# Patient Record
Sex: Female | Born: 1935 | Race: White | Hispanic: No | Smoking: Former smoker
Health system: Southern US, Community
[De-identification: ages and names within clinical notes are randomized; demographics above are authoritative.]

## PROBLEM LIST (undated history)

## (undated) DIAGNOSIS — I219 Acute myocardial infarction, unspecified: Secondary | ICD-10-CM

## (undated) DIAGNOSIS — I10 Essential (primary) hypertension: Secondary | ICD-10-CM

## (undated) DIAGNOSIS — I639 Cerebral infarction, unspecified: Secondary | ICD-10-CM

## (undated) DIAGNOSIS — M479 Spondylosis, unspecified: Secondary | ICD-10-CM

## (undated) DIAGNOSIS — Z87448 Personal history of other diseases of urinary system: Secondary | ICD-10-CM

## (undated) DIAGNOSIS — E785 Hyperlipidemia, unspecified: Secondary | ICD-10-CM

## (undated) DIAGNOSIS — I739 Peripheral vascular disease, unspecified: Secondary | ICD-10-CM

## (undated) HISTORY — DX: Spondylosis, unspecified: M47.9

## (undated) HISTORY — DX: Peripheral vascular disease, unspecified: I73.9

## (undated) HISTORY — DX: Hyperlipidemia, unspecified: E78.5

## (undated) HISTORY — DX: Essential (primary) hypertension: I10

## (undated) HISTORY — DX: Personal history of other diseases of urinary system: Z87.448

## (undated) HISTORY — DX: Acute myocardial infarction, unspecified: I21.9

## (undated) HISTORY — PX: JOINT REPLACEMENT: SHX530

## (undated) HISTORY — DX: Cerebral infarction, unspecified: I63.9

---

## 1966-09-18 HISTORY — PX: ABDOMINAL HYSTERECTOMY: SHX81

## 2001-09-18 HISTORY — PX: BLADDER SUSPENSION: SHX72

## 2004-07-07 ENCOUNTER — Ambulatory Visit: Payer: Self-pay

## 2004-09-18 HISTORY — PX: FRACTURE SURGERY: SHX138

## 2004-09-27 ENCOUNTER — Ambulatory Visit: Payer: Self-pay | Admitting: Orthopaedic Surgery

## 2004-09-29 ENCOUNTER — Ambulatory Visit: Payer: Self-pay | Admitting: Orthopaedic Surgery

## 2006-02-16 ENCOUNTER — Emergency Department: Payer: Self-pay | Admitting: Emergency Medicine

## 2007-01-23 ENCOUNTER — Emergency Department: Payer: Self-pay | Admitting: Emergency Medicine

## 2007-01-25 ENCOUNTER — Emergency Department: Payer: Self-pay | Admitting: Unknown Physician Specialty

## 2007-07-18 ENCOUNTER — Ambulatory Visit: Payer: Self-pay

## 2007-07-25 ENCOUNTER — Ambulatory Visit: Payer: Self-pay

## 2007-08-08 ENCOUNTER — Ambulatory Visit: Payer: Self-pay

## 2007-08-27 ENCOUNTER — Ambulatory Visit: Payer: Self-pay | Admitting: Surgery

## 2007-09-17 ENCOUNTER — Ambulatory Visit: Payer: Self-pay | Admitting: Surgery

## 2007-11-21 ENCOUNTER — Ambulatory Visit: Payer: Self-pay | Admitting: Internal Medicine

## 2008-01-20 ENCOUNTER — Ambulatory Visit: Payer: Self-pay | Admitting: Otolaryngology

## 2008-03-12 ENCOUNTER — Ambulatory Visit: Payer: Self-pay | Admitting: Unknown Physician Specialty

## 2008-07-23 ENCOUNTER — Ambulatory Visit: Payer: Self-pay | Admitting: Internal Medicine

## 2008-09-20 ENCOUNTER — Emergency Department: Payer: Self-pay | Admitting: Emergency Medicine

## 2008-09-23 ENCOUNTER — Ambulatory Visit: Payer: Self-pay | Admitting: Specialist

## 2008-09-29 ENCOUNTER — Ambulatory Visit: Payer: Self-pay | Admitting: Internal Medicine

## 2008-10-01 ENCOUNTER — Ambulatory Visit: Payer: Self-pay | Admitting: Internal Medicine

## 2008-11-11 ENCOUNTER — Emergency Department: Payer: Self-pay | Admitting: Unknown Physician Specialty

## 2009-02-25 ENCOUNTER — Ambulatory Visit: Payer: Self-pay | Admitting: Orthopedic Surgery

## 2009-03-19 ENCOUNTER — Ambulatory Visit: Payer: Self-pay | Admitting: Internal Medicine

## 2009-03-19 ENCOUNTER — Ambulatory Visit: Payer: Self-pay | Admitting: Orthopedic Surgery

## 2009-03-24 ENCOUNTER — Ambulatory Visit: Payer: Self-pay | Admitting: Orthopedic Surgery

## 2009-06-04 ENCOUNTER — Ambulatory Visit: Payer: Self-pay | Admitting: Internal Medicine

## 2009-07-22 ENCOUNTER — Ambulatory Visit: Payer: Self-pay | Admitting: Internal Medicine

## 2009-09-06 ENCOUNTER — Ambulatory Visit: Payer: Self-pay | Admitting: General Practice

## 2009-09-14 ENCOUNTER — Inpatient Hospital Stay: Payer: Self-pay | Admitting: General Practice

## 2009-09-30 ENCOUNTER — Inpatient Hospital Stay: Payer: Self-pay | Admitting: Internal Medicine

## 2010-03-10 ENCOUNTER — Ambulatory Visit: Payer: Self-pay | Admitting: Internal Medicine

## 2010-06-30 ENCOUNTER — Ambulatory Visit: Payer: Self-pay | Admitting: Surgery

## 2010-08-12 ENCOUNTER — Ambulatory Visit: Payer: Self-pay | Admitting: Vascular Surgery

## 2010-08-18 ENCOUNTER — Ambulatory Visit: Payer: Self-pay | Admitting: Vascular Surgery

## 2010-09-18 HISTORY — PX: CAROTID ENDARTERECTOMY: SUR193

## 2010-09-23 ENCOUNTER — Inpatient Hospital Stay: Payer: Medicare Other | Admitting: Vascular Surgery

## 2010-09-28 LAB — PATHOLOGY REPORT

## 2010-12-13 ENCOUNTER — Ambulatory Visit: Payer: Medicare Other | Admitting: Vascular Surgery

## 2011-02-17 DIAGNOSIS — Z8679 Personal history of other diseases of the circulatory system: Secondary | ICD-10-CM | POA: Insufficient documentation

## 2011-02-17 DIAGNOSIS — I639 Cerebral infarction, unspecified: Secondary | ICD-10-CM

## 2011-02-17 HISTORY — DX: Cerebral infarction, unspecified: I63.9

## 2011-02-19 LAB — HM MAMMOGRAPHY

## 2011-02-25 ENCOUNTER — Emergency Department: Payer: Medicare Other | Admitting: Emergency Medicine

## 2011-03-15 ENCOUNTER — Emergency Department: Payer: Medicare Other | Admitting: Emergency Medicine

## 2011-04-12 ENCOUNTER — Encounter: Payer: Medicare Other | Admitting: Internal Medicine

## 2011-04-19 LAB — HM MAMMOGRAPHY: HM Mammogram: NORMAL

## 2011-05-08 ENCOUNTER — Telehealth: Payer: Self-pay | Admitting: Internal Medicine

## 2011-05-08 NOTE — Telephone Encounter (Signed)
Called and scheduled appt for patient

## 2011-05-10 ENCOUNTER — Ambulatory Visit: Payer: Medicare Other | Admitting: Internal Medicine

## 2011-05-19 ENCOUNTER — Encounter: Payer: Self-pay | Admitting: Internal Medicine

## 2011-05-24 ENCOUNTER — Other Ambulatory Visit: Payer: Self-pay | Admitting: Internal Medicine

## 2011-05-25 ENCOUNTER — Telehealth: Payer: Self-pay | Admitting: Internal Medicine

## 2011-05-26 ENCOUNTER — Other Ambulatory Visit: Payer: Self-pay | Admitting: Internal Medicine

## 2011-05-29 MED ORDER — LEVETIRACETAM 1000 MG PO TABS: 1000.0000 mg | ORAL_TABLET | Freq: Two times a day (BID) | ORAL | Status: DC

## 2011-05-29 NOTE — Telephone Encounter (Signed)
Was this my patient or Teresa's?  Thanks

## 2011-05-30 ENCOUNTER — Telehealth: Payer: Self-pay | Admitting: Internal Medicine

## 2011-05-30 ENCOUNTER — Ambulatory Visit: Payer: Self-pay | Admitting: Internal Medicine

## 2011-05-30 NOTE — Telephone Encounter (Signed)
Patient was late to her appt so she was asked to reschedule and she was fine with that but she wanted me to ask you about a medication.  She stated her urologist in [facility] prescribed levetiracetam 1,000 mg one tablet two times daily.  She stated she has been out of this medication since Friday, but her urologist told her to talk to you whether she should continue this medication or go back on Plavix.   She wanted to know what you wanted her to do.  Please advise.

## 2011-05-30 NOTE — Telephone Encounter (Signed)
I'm afraid I don't understand the question .  Those two medications are not used for the same things. One is for seizures and one is to prevent strokes.  Why did her urologist put her on an antiseziure medication?  Do you mean her neurologist?  Can we get the records from the [facility] doctor?

## 2011-06-05 ENCOUNTER — Encounter: Payer: Self-pay | Admitting: Internal Medicine

## 2011-06-07 ENCOUNTER — Encounter: Payer: Self-pay | Admitting: Internal Medicine

## 2011-06-07 ENCOUNTER — Other Ambulatory Visit: Payer: Self-pay | Admitting: Internal Medicine

## 2011-06-07 ENCOUNTER — Ambulatory Visit (INDEPENDENT_AMBULATORY_CARE_PROVIDER_SITE_OTHER): Payer: Medicare Other | Admitting: Internal Medicine

## 2011-06-07 DIAGNOSIS — I70209 Unspecified atherosclerosis of native arteries of extremities, unspecified extremity: Secondary | ICD-10-CM

## 2011-06-07 DIAGNOSIS — Z79899 Other long term (current) drug therapy: Secondary | ICD-10-CM

## 2011-06-07 DIAGNOSIS — I639 Cerebral infarction, unspecified: Secondary | ICD-10-CM

## 2011-06-07 DIAGNOSIS — I10 Essential (primary) hypertension: Secondary | ICD-10-CM

## 2011-06-07 DIAGNOSIS — I219 Acute myocardial infarction, unspecified: Secondary | ICD-10-CM

## 2011-06-07 DIAGNOSIS — I6350 Cerebral infarction due to unspecified occlusion or stenosis of unspecified cerebral artery: Secondary | ICD-10-CM

## 2011-06-07 DIAGNOSIS — E039 Hypothyroidism, unspecified: Secondary | ICD-10-CM

## 2011-06-07 DIAGNOSIS — E785 Hyperlipidemia, unspecified: Secondary | ICD-10-CM

## 2011-06-07 DIAGNOSIS — E559 Vitamin D deficiency, unspecified: Secondary | ICD-10-CM

## 2011-06-07 DIAGNOSIS — Z1211 Encounter for screening for malignant neoplasm of colon: Secondary | ICD-10-CM

## 2011-06-07 NOTE — Patient Instructions (Addendum)
Your blood pressure is elevated today.  Please check your blood pressure and pulse 5 or 6 times over the next few weeks and send me the results so I can decide whether we need to adjust your medications.    You need to keep taking the levetiracetam for a total of six months to prevent seizures from your head injury.  Return in one week with fasting labs.

## 2011-06-09 MED ORDER — HYDROCODONE-ACETAMINOPHEN 5-325 MG PO TABS: 1.0000 | ORAL_TABLET | ORAL | Status: AC | PRN

## 2011-06-10 ENCOUNTER — Encounter: Payer: Self-pay | Admitting: Internal Medicine

## 2011-06-10 DIAGNOSIS — E785 Hyperlipidemia, unspecified: Secondary | ICD-10-CM | POA: Insufficient documentation

## 2011-06-10 DIAGNOSIS — M479 Spondylosis, unspecified: Secondary | ICD-10-CM | POA: Insufficient documentation

## 2011-06-10 DIAGNOSIS — I219 Acute myocardial infarction, unspecified: Secondary | ICD-10-CM | POA: Insufficient documentation

## 2011-06-10 DIAGNOSIS — I10 Essential (primary) hypertension: Secondary | ICD-10-CM | POA: Insufficient documentation

## 2011-06-10 DIAGNOSIS — I70209 Unspecified atherosclerosis of native arteries of extremities, unspecified extremity: Secondary | ICD-10-CM | POA: Insufficient documentation

## 2011-06-10 DIAGNOSIS — Z1211 Encounter for screening for malignant neoplasm of colon: Secondary | ICD-10-CM | POA: Insufficient documentation

## 2011-06-10 NOTE — Assessment & Plan Note (Addendum)
Not at goal today.  Will review home bps before making adjusttments .  Will add ace inhibitor if needed given history of CAD

## 2011-06-10 NOTE — Assessment & Plan Note (Signed)
She has been released from Neurosurgery followup at [facility] and will need to remain on Keppra for prevention of seizures indefinitely .

## 2011-06-10 NOTE — Assessment & Plan Note (Signed)
Goal of lipid managem,ent is LDL < 70 given multiple interventions.

## 2011-06-10 NOTE — Progress Notes (Signed)
  Subjective:    Patient ID: Leah Preston, female    DOB: May 20, 1931, 77 y.o.   MRN: [id number]  HPI  77 yo white female with history of intracerebral hemorrhage Jun 2012 (left parietal SDH/SAH) with residual slurred speech and mild ataxia returns for regular followup.  No new issues.  Recovery almost complete from recent stroke, and was released from PT for gait and speech.    Review of Systems  Constitutional: Negative for fever, chills and unexpected weight change.  HENT: Negative for hearing loss, ear pain, nosebleeds, congestion, sore throat, facial swelling, rhinorrhea, sneezing, mouth sores, trouble swallowing, neck pain, neck stiffness, voice change, postnasal drip, sinus pressure, tinnitus and ear discharge.   Eyes: Negative for pain, discharge, redness and visual disturbance.  Respiratory: Negative for cough, chest tightness, shortness of breath, wheezing and stridor.   Cardiovascular: Negative for chest pain, palpitations and leg swelling.  Musculoskeletal: Positive for back pain. Negative for myalgias and arthralgias.  Skin: Negative for color change and rash.  Neurological: Positive for dizziness and weakness. Negative for light-headedness and headaches.  Hematological: Negative for adenopathy.       Objective:   Physical Exam  Constitutional: She is oriented to person, place, and time. She appears well-developed and well-nourished.  HENT:  Head: Normocephalic.  Mouth/Throat: Oropharynx is clear and moist.  Eyes: EOM are normal. Pupils are equal, round, and reactive to light. No scleral icterus.  Neck: Normal range of motion. Neck supple. No JVD present. No thyromegaly present.  Cardiovascular: Normal rate, regular rhythm, normal heart sounds and intact distal pulses.   Pulmonary/Chest: Effort normal and breath sounds normal.  Abdominal: Soft. Bowel sounds are normal. She exhibits no mass. There is no tenderness.  Musculoskeletal: Normal range of motion. She exhibits no  edema.  Lymphadenopathy:    She has no cervical adenopathy.  Neurological: She is alert and oriented to person, place, and time. She has normal strength. A cranial nerve deficit is present. No sensory deficit.       Slight slurring of speech  Skin: Skin is warm and dry.  Psychiatric: She has a normal mood and affect. Thought content normal.          Assessment & Plan:

## 2011-06-10 NOTE — Assessment & Plan Note (Signed)
On Lipitor 40 mg daily foe goal LDL < 70.  Repeat labs ordered

## 2011-06-14 ENCOUNTER — Other Ambulatory Visit (INDEPENDENT_AMBULATORY_CARE_PROVIDER_SITE_OTHER): Payer: Medicare Other | Admitting: *Deleted

## 2011-06-14 DIAGNOSIS — E039 Hypothyroidism, unspecified: Secondary | ICD-10-CM

## 2011-06-14 DIAGNOSIS — E785 Hyperlipidemia, unspecified: Secondary | ICD-10-CM

## 2011-06-14 DIAGNOSIS — Z79899 Other long term (current) drug therapy: Secondary | ICD-10-CM

## 2011-06-14 DIAGNOSIS — E559 Vitamin D deficiency, unspecified: Secondary | ICD-10-CM

## 2011-06-14 LAB — COMPREHENSIVE METABOLIC PANEL
AST: 21 U/L (ref 0–37)
Alkaline Phosphatase: 44 U/L (ref 39–117)
BUN: 28 mg/dL — ABNORMAL HIGH (ref 6–23)
Glucose, Bld: 88 mg/dL (ref 70–99)
Potassium: 4.2 mEq/L (ref 3.5–5.1)
Sodium: 140 mEq/L (ref 135–145)
Total Bilirubin: 0.5 mg/dL (ref 0.3–1.2)
Total Protein: 7.3 g/dL (ref 6.0–8.3)

## 2011-06-14 LAB — LIPID PANEL
HDL: 52.2 mg/dL (ref >39.00)
LDL Cholesterol: 53 mg/dL (ref 0–99)
VLDL: 30.4 mg/dL (ref 0.0–40.0)

## 2011-06-14 LAB — TSH: TSH: 1.61 u[IU]/mL (ref 0.35–5.50)

## 2011-06-15 LAB — VITAMIN D 25 HYDROXY (VIT D DEFICIENCY, FRACTURES): Vit D, 25-Hydroxy: 42 ng/mL (ref 30–89)

## 2011-06-20 ENCOUNTER — Telehealth: Payer: Self-pay | Admitting: Internal Medicine

## 2011-06-20 NOTE — Telephone Encounter (Signed)
They are better but I would like them a little lower.  Please tell her I want to add lisinopril 20 mg daioy to her current regimen  Qty #30 with 5 refills.

## 2011-06-20 NOTE — Telephone Encounter (Signed)
Patient stated you wanted her to call in with her BP readings, she did not get the pulse.  BP: 134/65    135/64    142/80    142/72    140/80

## 2011-06-21 MED ORDER — LISINOPRIL 20 MG PO TABS: 20.0000 mg | ORAL_TABLET | Freq: Every day | ORAL | Status: DC

## 2011-06-21 NOTE — Telephone Encounter (Signed)
Patient notified- Rx called to pharmacy. 

## 2011-07-21 ENCOUNTER — Telehealth: Payer: Self-pay | Admitting: Internal Medicine

## 2011-07-21 NOTE — Telephone Encounter (Signed)
[facility]  On [address].

## 2011-07-21 NOTE — Telephone Encounter (Signed)
Please find out if patient has tried Delsym OTC  Yet,  If she is having fevers productice cogh,  Shortness of breath?

## 2011-07-21 NOTE — Telephone Encounter (Signed)
Patient stated she has not tried Delsym yet but will try to get some and try over the weekend.  She stated she is not having a fever, productive cough, or SOB.  She just has the cough.  She will call back on Monday if not any better with the Delsym.

## 2011-08-25 ENCOUNTER — Other Ambulatory Visit: Payer: Self-pay | Admitting: Internal Medicine

## 2011-09-06 ENCOUNTER — Encounter: Payer: Self-pay | Admitting: Internal Medicine

## 2011-09-06 ENCOUNTER — Ambulatory Visit (INDEPENDENT_AMBULATORY_CARE_PROVIDER_SITE_OTHER): Payer: 59 | Admitting: Internal Medicine

## 2011-09-06 VITALS — BP 130/58 | HR 72 | Temp 97.7°F | Ht 60.25 in | Wt 125.8 lb

## 2011-09-06 DIAGNOSIS — I6350 Cerebral infarction due to unspecified occlusion or stenosis of unspecified cerebral artery: Secondary | ICD-10-CM

## 2011-09-06 DIAGNOSIS — T753XXA Motion sickness, initial encounter: Secondary | ICD-10-CM

## 2011-09-06 DIAGNOSIS — E785 Hyperlipidemia, unspecified: Secondary | ICD-10-CM

## 2011-09-06 DIAGNOSIS — I639 Cerebral infarction, unspecified: Secondary | ICD-10-CM

## 2011-09-06 DIAGNOSIS — I70209 Unspecified atherosclerosis of native arteries of extremities, unspecified extremity: Secondary | ICD-10-CM

## 2011-09-06 DIAGNOSIS — I10 Essential (primary) hypertension: Secondary | ICD-10-CM

## 2011-09-06 MED ORDER — AMLODIPINE BESYLATE 10 MG PO TABS: 10.0000 mg | ORAL_TABLET | Freq: Every day | ORAL | Status: DC

## 2011-09-06 MED ORDER — LEVOTHYROXINE SODIUM 50 MCG PO TABS: 50.0000 ug | ORAL_TABLET | Freq: Every day | ORAL | Status: DC

## 2011-09-06 MED ORDER — LEVETIRACETAM 1000 MG PO TABS: 1000.0000 mg | ORAL_TABLET | Freq: Two times a day (BID) | ORAL | Status: DC

## 2011-09-06 MED ORDER — ESOMEPRAZOLE MAGNESIUM 40 MG PO CPDR: 40.0000 mg | DELAYED_RELEASE_CAPSULE | Freq: Every day | ORAL | Status: DC

## 2011-09-06 MED ORDER — MELOXICAM 15 MG PO TABS: 15.0000 mg | ORAL_TABLET | Freq: Every day | ORAL | Status: DC

## 2011-09-06 MED ORDER — LISINOPRIL 20 MG PO TABS: 20.0000 mg | ORAL_TABLET | Freq: Every day | ORAL | Status: DC

## 2011-09-06 MED ORDER — ATORVASTATIN CALCIUM 40 MG PO TABS: 40.0000 mg | ORAL_TABLET | Freq: Every day | ORAL | Status: DC

## 2011-09-06 MED ORDER — CHOLINE FENOFIBRATE 135 MG PO CPDR: 135.0000 mg | DELAYED_RELEASE_CAPSULE | Freq: Every day | ORAL | Status: DC

## 2011-09-06 MED ORDER — SCOPOLAMINE 1 MG/3DAYS TD PT72: 1.0000 | MEDICATED_PATCH | TRANSDERMAL | Status: DC

## 2011-09-06 MED ORDER — SCOPOLAMINE 1 MG/3DAYS TD PT72: 1.0000 | MEDICATED_PATCH | TRANSDERMAL | Status: AC

## 2011-09-06 MED ORDER — AMITRIPTYLINE HCL 50 MG PO TABS: 50.0000 mg | ORAL_TABLET | Freq: Every day | ORAL | Status: DC

## 2011-09-06 MED ORDER — CLORAZEPATE DIPOTASSIUM 7.5 MG PO TABS: 7.5000 mg | ORAL_TABLET | Freq: Every day | ORAL | Status: DC

## 2011-09-06 MED ORDER — METOPROLOL TARTRATE 50 MG PO TABS: 50.0000 mg | ORAL_TABLET | Freq: Two times a day (BID) | ORAL | Status: DC

## 2011-09-06 NOTE — Patient Instructions (Signed)
You should try taking allegra (fexofenadine) ,  claritin (Loratidine), or Zyrtec (cetirizine) daily for allergies.  They are all over the counter antihistamines  Continue Keppra for your brain.    I am prescribing transcopalamine patches to prevent sea  sickness.  Change every 72 hours

## 2011-09-07 ENCOUNTER — Encounter: Payer: Self-pay | Admitting: Internal Medicine

## 2011-09-10 ENCOUNTER — Encounter: Payer: Self-pay | Admitting: Internal Medicine

## 2011-09-10 DIAGNOSIS — I739 Peripheral vascular disease, unspecified: Secondary | ICD-10-CM | POA: Insufficient documentation

## 2011-09-10 NOTE — Assessment & Plan Note (Signed)
Well controlled,  No changes to current regimen.

## 2011-09-10 NOTE — Assessment & Plan Note (Signed)
Continue statin, asa, tobacco abstinence.

## 2011-09-10 NOTE — Progress Notes (Signed)
  Subjective:    Patient ID: Leah Preston, female    DOB: 05-Aug-1931, 77 y.o.   MRN: [id number]  HPI  Leah Preston is a 77 yr old white female with  A history of intracranial bleed secondary to fall in 2012, hospitalized at [facility],  now on prophylactic anti seizure therapy with Keppra,  Hypertension and hyperlipidemia, and DJD who presents for follow up on chronic medical conditions.  She has had no recent falls, and has had no loss of consciousness or headaches.  She is compliant with her medications and is having no side effects but wants to stop the Keppra as soon as possible.     Review of Systems  Constitutional: Negative for fever, chills and unexpected weight change.  HENT: Negative for hearing loss, ear pain, nosebleeds, congestion, sore throat, facial swelling, rhinorrhea, sneezing, mouth sores, trouble swallowing, neck pain, neck stiffness, voice change, postnasal drip, sinus pressure, tinnitus and ear discharge.   Eyes: Negative for pain, discharge, redness and visual disturbance.  Respiratory: Negative for cough, chest tightness, shortness of breath, wheezing and stridor.   Cardiovascular: Negative for chest pain, palpitations and leg swelling.  Musculoskeletal: Negative for myalgias and arthralgias.  Skin: Negative for color change and rash.  Neurological: Negative for dizziness, weakness, light-headedness and headaches.  Hematological: Negative for adenopathy.       Objective:   Physical Exam  Constitutional: She is oriented to person, place, and time. She appears well-developed and well-nourished.  HENT:  Mouth/Throat: Oropharynx is clear and moist.  Eyes: EOM are normal. Pupils are equal, round, and reactive to light. No scleral icterus.  Neck: Normal range of motion. Neck supple. No JVD present. No thyromegaly present.  Cardiovascular: Normal rate, regular rhythm, normal heart sounds and intact distal pulses.   Pulmonary/Chest: Effort normal and breath sounds normal.    Abdominal: Soft. Bowel sounds are normal. She exhibits no mass. There is no tenderness.  Musculoskeletal: Normal range of motion. She exhibits no edema.  Lymphadenopathy:    She has no cervical adenopathy.  Neurological: She is alert and oriented to person, place, and time.  Skin: Skin is warm and dry.  Psychiatric: She has a normal mood and affect.          Assessment & Plan:

## 2011-09-10 NOTE — Assessment & Plan Note (Signed)
We discussed when to stop her Keppra,  Will review records from [facility] neurology when available.  Since hse is going out of town for 3 months, advised her not to dicontinue medication while away.

## 2011-09-10 NOTE — Assessment & Plan Note (Signed)
With history of CAD and PAD,  Well controlled on lipitor and Trilipix.  No changes today

## 2011-09-14 ENCOUNTER — Ambulatory Visit: Payer: Medicare Other | Admitting: Internal Medicine

## 2011-09-18 ENCOUNTER — Other Ambulatory Visit: Payer: Self-pay | Admitting: Internal Medicine

## 2011-09-18 NOTE — Telephone Encounter (Signed)
Please ask patient why she is requestion a refill on ciprofloxacin, which is an antibiotiic.

## 2011-11-06 ENCOUNTER — Other Ambulatory Visit: Payer: Self-pay | Admitting: Internal Medicine

## 2011-11-30 ENCOUNTER — Other Ambulatory Visit: Payer: Self-pay | Admitting: Internal Medicine

## 2011-11-30 MED ORDER — LISINOPRIL 20 MG PO TABS: 20.0000 mg | ORAL_TABLET | Freq: Every day | ORAL | Status: DC

## 2011-12-01 ENCOUNTER — Other Ambulatory Visit: Payer: Self-pay | Admitting: Internal Medicine

## 2011-12-01 MED ORDER — CIPROFLOXACIN HCL 250 MG PO TABS: 250.0000 mg | ORAL_TABLET | Freq: Two times a day (BID) | ORAL | Status: AC

## 2011-12-05 ENCOUNTER — Ambulatory Visit: Payer: Medicare Other | Admitting: Internal Medicine

## 2011-12-21 ENCOUNTER — Other Ambulatory Visit: Payer: Self-pay | Admitting: Internal Medicine

## 2011-12-27 ENCOUNTER — Other Ambulatory Visit: Payer: Self-pay | Admitting: Internal Medicine

## 2012-01-09 ENCOUNTER — Ambulatory Visit (INDEPENDENT_AMBULATORY_CARE_PROVIDER_SITE_OTHER): Payer: 59 | Admitting: Internal Medicine

## 2012-01-09 ENCOUNTER — Encounter: Payer: Self-pay | Admitting: Internal Medicine

## 2012-01-09 VITALS — BP 120/60 | HR 76 | Temp 98.1°F | Resp 16 | Wt 130.8 lb

## 2012-01-09 DIAGNOSIS — R410 Disorientation, unspecified: Secondary | ICD-10-CM

## 2012-01-09 DIAGNOSIS — I639 Cerebral infarction, unspecified: Secondary | ICD-10-CM

## 2012-01-09 DIAGNOSIS — R05 Cough: Secondary | ICD-10-CM

## 2012-01-09 DIAGNOSIS — D649 Anemia, unspecified: Secondary | ICD-10-CM

## 2012-01-09 DIAGNOSIS — I10 Essential (primary) hypertension: Secondary | ICD-10-CM

## 2012-01-09 DIAGNOSIS — I6350 Cerebral infarction due to unspecified occlusion or stenosis of unspecified cerebral artery: Secondary | ICD-10-CM

## 2012-01-09 DIAGNOSIS — R5383 Other fatigue: Secondary | ICD-10-CM

## 2012-01-09 DIAGNOSIS — E538 Deficiency of other specified B group vitamins: Secondary | ICD-10-CM

## 2012-01-09 DIAGNOSIS — N189 Chronic kidney disease, unspecified: Secondary | ICD-10-CM

## 2012-01-09 DIAGNOSIS — F29 Unspecified psychosis not due to a substance or known physiological condition: Secondary | ICD-10-CM

## 2012-01-09 LAB — COMPLETE METABOLIC PANEL WITH GFR
ALT: 23 U/L (ref 0–35)
AST: 19 U/L (ref 0–37)
Alkaline Phosphatase: 38 U/L — ABNORMAL LOW (ref 39–117)
CO2: 28 mEq/L (ref 19–32)
GFR, Est African American: 54 mL/min — ABNORMAL LOW
Sodium: 135 mEq/L (ref 135–145)
Total Bilirubin: 0.4 mg/dL (ref 0.3–1.2)
Total Protein: 7 g/dL (ref 6.0–8.3)

## 2012-01-09 NOTE — Patient Instructions (Signed)
We are scheduling you for a swallow evaluation and an MRI of your brain.  Please continue the Keppra for now

## 2012-01-09 NOTE — Assessment & Plan Note (Addendum)
S/p left parietal bleed, with airlift to [facility] last year for stabilization and rehab.  Given her current new symptoms of altered mental status for the last 2 months ,  Will order MRI for evaluation ,

## 2012-01-09 NOTE — Progress Notes (Signed)
Patient ID: Leah Preston, female   DOB: [birth year], 78 y.o.   MRN: [id number]  Patient Active Problem List  Diagnoses  . Hyperlipidemia  . Hypertension  . subdurah/subarachnoid hemorrhage  . OA (osteoarthritis of spine)  . coronary artery disease  . Screening for colon cancer  . Atherosclerotic peripheral vascular disease  . Peripheral vascular disease  . Cough    Subjective:  CC:   Chief Complaint  Patient presents with  . Follow-up    HPI:   Leah Preston a 78 y.o. female who presents For 6 month follow up.  She has bee vacationing for the last 3 months in [place], [place].  While down there she developed a cough which is aggravated  By eating, which started in January.  She was told by a pharmacist that her blood pressure medication caused it. She has also been feeling fuzzy headed, for the last month or so.  No numbness or tingling.  Occasional minor headahces, not daily and not severe.  She denies nausea.  She  decreased  Her Keppra dose  to once daily with no change in symptoms.    Past Medical History  Diagnosis Date  . Peripheral vascular disease     s/p carotid endarterectomy  . History of bladder problems   . Hypertension   . Hyperlipidemia   . OA (osteoarthritis of spine)     with chronic pain  . coronary artery disease   . subdurah/subarachnoid hemorrhage jun 2012    left parietal, sent to [facility]    Past Surgical History  Procedure Date  . Cesarean section   . Carotid endarterectomy jan 2012    left, complicated by large hematoma, evacuated  . Fracture surgery 2006    Left radial pinning  . Joint replacement     Left TKR 2010, [facility]  . Bladder suspension 2003    [facility]         The following portions of the patient's history were reviewed and updated as appropriate: Allergies, current medications, and problem list.    Review of Systems:   12 Pt  review of systems was negative except those addressed in the HPI,     History   Social History  .  Marital Status: Single    Spouse Name: N/A    Number of Children: N/A  . Years of Education: N/A   Occupational History  . Not on file.   Social History Main Topics  . Smoking status: Former Smoker    Quit date: 06/07/1995  . Smokeless tobacco: Never Used  . Alcohol Use: No  . Drug Use: No  . Sexually Active: Not on file   Other Topics Concern  . Not on file   Social History Narrative   Lives alone after divorce in 63. 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 ROM normal. No CVA tenderness. Lungs: clear to auscultation bilaterally Heart: regular  rate and rhythm, S1, S2 normal, no murmur, click, rub or gallop Abdomen: soft, non-tender; bowel sounds normal; no masses,  no organomegaly Pulses: 2+ and symmetric Skin: Skin color, texture, turgor normal. No rashes or lesions Lymph nodes: Cervical, supraclavicular, and axillary nodes normal.  Assessment and Plan:  subdurah/subarachnoid hemorrhage S/p left parietal bleed, with airlift to [facility] last year for stabilization and rehab.  Given her current new symptoms of altered mental status for the last 2 months ,  Will order MRI for evaluation ,   Cough Etiology appears to be dysphagia .  Sending for modified barium swallow. If normal will consider suspending ACE Inhibitor.   Hypertension Well controlled on current medications.   No changes today.  Anemia Etiology unclear.  B12 level is normal.  Will need iron studies.     Updated Medication List Outpatient Encounter Prescriptions as of 01/09/2012  Medication Sig Dispense Refill  . amLODipine (NORVASC) 10 MG tablet TAKE 1 TABLET DAILY  90 tablet  2  . Ascorbic Acid (VITAMIN C PO) Take by mouth daily.        Marland Kitchen atorvastatin (LIPITOR) 40 MG tablet TAKE 1 TABLET DAILY  90 tablet  2  . clorazepate (TRANXENE) 7.5 MG tablet Take 1 tablet (7.5 mg total) by mouth daily.  30 tablet  3  . esomeprazole (NEXIUM) 40 MG capsule Take 1 capsule (40 mg total) by mouth daily before breakfast.  30 capsule  3  . levETIRAcetam (KEPPRA) 1000 MG tablet Take 1,000 mg by mouth daily.      Marland Kitchen levothyroxine (SYNTHROID, LEVOTHROID) 50 MCG tablet TAKE 1 TABLET DAILY  90 tablet  2  . lisinopril (PRINIVIL,ZESTRIL) 20 MG tablet Take 1 tablet (20 mg total) by mouth daily.  90 tablet  4  . meloxicam (MOBIC) 15 MG tablet Take 1 tablet (15 mg total) by mouth daily.  30 tablet  3  . metoprolol (LOPRESSOR) 50 MG tablet Take 1 tablet (50 mg total) by mouth 2 (two) times daily.  60 tablet  3  . Multiple Vitamins-Minerals (CENTRUM SILVER PO) Take by mouth daily.        Marland Kitchen scopolamine (TRANSDERM-SCOP) 1.5 MG Place 1 patch (1.5 mg total) onto the skin every 3 (three) days.  10 patch  0  . TRILIPIX 135 MG capsule TAKE 1 CAPSULE DAILY  90 capsule  2  . DISCONTD: amitriptyline (ELAVIL) 50 MG tablet Take 1 tablet (50 mg total) by mouth daily.  30 tablet  3  . DISCONTD: levETIRAcetam (KEPPRA) 1000 MG tablet Take 1 tablet (1,000 mg total) by mouth 2 (two) times daily.  60 tablet  3

## 2012-01-10 ENCOUNTER — Other Ambulatory Visit: Payer: Self-pay | Admitting: Internal Medicine

## 2012-01-10 ENCOUNTER — Encounter: Payer: Self-pay | Admitting: Internal Medicine

## 2012-01-10 DIAGNOSIS — R059 Cough, unspecified: Secondary | ICD-10-CM | POA: Insufficient documentation

## 2012-01-10 DIAGNOSIS — D649 Anemia, unspecified: Secondary | ICD-10-CM | POA: Insufficient documentation

## 2012-01-10 DIAGNOSIS — R05 Cough: Secondary | ICD-10-CM | POA: Insufficient documentation

## 2012-01-10 LAB — CBC WITH DIFFERENTIAL/PLATELET
Basophils Relative: 0.2 % (ref 0.0–3.0)
Eosinophils Absolute: 0.1 10*3/uL (ref 0.0–0.7)
Eosinophils Relative: 0.9 % (ref 0.0–5.0)
HCT: 32.1 % — ABNORMAL LOW (ref 36.0–46.0)
Hemoglobin: 10.9 g/dL — ABNORMAL LOW (ref 12.0–15.0)
MCHC: 33.8 g/dL (ref 30.0–36.0)
MCV: 93.6 fl (ref 78.0–100.0)
Monocytes Absolute: 0.8 10*3/uL (ref 0.1–1.0)
Neutro Abs: 5.5 10*3/uL (ref 1.4–7.7)
RBC: 3.43 Mil/uL — ABNORMAL LOW (ref 3.87–5.11)
WBC: 8.8 10*3/uL (ref 4.5–10.5)

## 2012-01-10 NOTE — Assessment & Plan Note (Signed)
Etiology appears to be dysphagia .  Sending for modified barium swallow. If normal will consider suspending ACE Inhibitor.

## 2012-01-10 NOTE — Assessment & Plan Note (Signed)
Etiology unclear.  B12 level is normal.  Will need iron studies.

## 2012-01-10 NOTE — Assessment & Plan Note (Signed)
Well controlled on current medications.  No changes today. 

## 2012-01-15 ENCOUNTER — Telehealth: Payer: Self-pay | Admitting: Internal Medicine

## 2012-01-15 MED ORDER — TRAMADOL HCL 50 MG PO TABS: 50.0000 mg | ORAL_TABLET | Freq: Four times a day (QID) | ORAL | Status: AC | PRN

## 2012-01-15 NOTE — Telephone Encounter (Signed)
I called patient to give her the MRI appointment.  She asked if she could get something called in for pain, she states that her bones hurt and she can really tell when it has been raining.

## 2012-01-15 NOTE — Telephone Encounter (Signed)
Rx has been called in, patient notified. 

## 2012-01-15 NOTE — Telephone Encounter (Signed)
Yes, but she has an allergy listed to oxycontin with no detail , so call in tramadol  50 mg one tablet every 6 hours prn pain  #90 1 refill.

## 2012-01-16 ENCOUNTER — Ambulatory Visit: Payer: Self-pay | Admitting: Internal Medicine

## 2012-01-17 ENCOUNTER — Ambulatory Visit (HOSPITAL_COMMUNITY)
Admission: RE | Admit: 2012-01-17 | Discharge: 2012-01-17 | Disposition: A | Discharge status: Home / Self Care | Payer: Medicare Other | Source: Ambulatory Visit | Attending: Internal Medicine | Admitting: Internal Medicine

## 2012-01-17 ENCOUNTER — Telehealth: Payer: Self-pay | Admitting: Internal Medicine

## 2012-01-17 DIAGNOSIS — R05 Cough: Secondary | ICD-10-CM

## 2012-01-17 DIAGNOSIS — M1990 Unspecified osteoarthritis, unspecified site: Secondary | ICD-10-CM | POA: Insufficient documentation

## 2012-01-17 DIAGNOSIS — E785 Hyperlipidemia, unspecified: Secondary | ICD-10-CM | POA: Insufficient documentation

## 2012-01-17 DIAGNOSIS — I2510 Atherosclerotic heart disease of native coronary artery without angina pectoris: Secondary | ICD-10-CM | POA: Insufficient documentation

## 2012-01-17 DIAGNOSIS — I10 Essential (primary) hypertension: Secondary | ICD-10-CM | POA: Insufficient documentation

## 2012-01-17 DIAGNOSIS — R059 Cough, unspecified: Secondary | ICD-10-CM | POA: Insufficient documentation

## 2012-01-17 DIAGNOSIS — R1310 Dysphagia, unspecified: Secondary | ICD-10-CM | POA: Insufficient documentation

## 2012-01-17 DIAGNOSIS — G8929 Other chronic pain: Secondary | ICD-10-CM | POA: Insufficient documentation

## 2012-01-17 NOTE — Telephone Encounter (Signed)
Her MRI results suggest she has a left side sinus infection, but it could be an artifact from her prior hemorrhafe. .  Is she having any pain behind her left ear?

## 2012-01-17 NOTE — Procedures (Signed)
Objective Swallowing Evaluation: Modified Barium Swallowing Study  Patient Details  Name: Leah Preston MRN: [id number] Date of Birth: 01-03-1931  Today's Date: 01/17/2012 Time: 7829-5621 SLP Time Calculation (min): 30 min  Past Medical History:  Past Medical History  Diagnosis Date  . Peripheral vascular disease     s/p carotid endarterectomy  . History of bladder problems   . Hypertension   . Hyperlipidemia   . OA (osteoarthritis of spine)     with chronic pain  . coronary artery disease   . subdurah/subarachnoid hemorrhage jun 2012    left parietal, sent to [facility]   Past Surgical History:  Past Surgical History  Procedure Date  . Cesarean section   . Carotid endarterectomy jan 2012    left, complicated by large hematoma, evacuated  . Fracture surgery 2006    Left radial pinning  . Joint replacement     Left TKR 2010, [facility]  . Bladder suspension 2003    [facility]   HPI:  78 y.o. female with hx of GERD (takes Nexium) and SDH last year, referred for OPMBS secondary to frequent coughing associated with PO intake (primarily solid foods per pt).  Pt describes 3-4 month duration, but notable improvement the last several days.     Assessment / Plan / Recommendation Clinical Impression  Dysphagia Diagnosis: Within Functional Limits Clinical impression: Pt presents with functional oropharyngeal swallow with intermittent penetration of thin liquids, but no aspiration; strong pharyngeal contraction/clearance.  Esophageal screen revealed clearance of barium and barium pill /no stasis post swallow.   No f/u recommended; continue on current diet.    Treatment Recommendation  No treatment recommended at this time    Diet Recommendation Regular;Thin liquid   Other  Recommendations     Follow Up Recommendations  None             SLP Swallow Goals     General Date of Onset: 10/20/11 HPI: 78 y.o. female with hx of GERD (takes Nexium) and SDH last year, referred for OPMBS secondary  to frequent coughing associated with PO intake (primarily solid foods per pt).  Pt describes 3-4 month duration, but notable improvement the last several days. Type of Study: Modified Barium Swallowing Study Diet Prior to this Study: Regular;Thin liquids Temperature Spikes Noted: No Respiratory Status: Room air History of Intubation: Yes Behavior/Cognition: Alert;Cooperative;Pleasant mood Oral Cavity - Dentition: Adequate natural dentition Oral Motor / Sensory Function: Within functional limits Vision: Functional for self-feeding Patient Positioning: Upright in chair Baseline Vocal Quality: Clear Volitional Cough: Strong Volitional Swallow: Able to elicit Anatomy: Within functional limits presence of what appear to be osteophytes (not confirmed by radiologist)      Oral Phase   [facility]  Pharyngeal Phase Pharyngeal Phase: Impaired   Cervical Esophageal Phase Cervical Esophageal Phase: [facility]     Leah Preston L. Samson Leah Preston, [place] CCC/SLP Pager [phone]  Blenda Mounts Laurice 01/17/2012, 11:29 AM

## 2012-01-18 NOTE — Telephone Encounter (Signed)
Left message asking patient to return my call.

## 2012-01-24 NOTE — Telephone Encounter (Signed)
Patient notified

## 2012-01-24 NOTE — Telephone Encounter (Signed)
Patient notified, she stated she does have pain behind her left ear some days but not everyday.

## 2012-01-24 NOTE — Telephone Encounter (Signed)
If i tis i not persistent , I would not attribute it to an infection and therefore would not treat.

## 2012-01-29 ENCOUNTER — Encounter: Payer: Self-pay | Admitting: Internal Medicine

## 2012-01-30 ENCOUNTER — Other Ambulatory Visit: Payer: Self-pay | Admitting: Internal Medicine

## 2012-01-30 DIAGNOSIS — N189 Chronic kidney disease, unspecified: Secondary | ICD-10-CM

## 2012-01-30 DIAGNOSIS — D649 Anemia, unspecified: Secondary | ICD-10-CM

## 2012-01-30 NOTE — Telephone Encounter (Signed)
Please let Leah Preston know that the meloxicam is not a good idea right now because her kidney function was a little off last time we checked.  I would like her to refrain from taking melosicam and otc meds like ibuprofen, alleve and motrin.  Seh can take tylenol and I can call in tramadol if tylenol is not enough.  I want to repeat  nonfasting in one month to see if her kidney function is better.  I will put labs in [facility]

## 2012-01-30 NOTE — Telephone Encounter (Signed)
Addended by: Duncan Dull on: 01/30/2012 01:30 PM   Modules accepted: Orders

## 2012-02-13 ENCOUNTER — Other Ambulatory Visit: Payer: Self-pay | Admitting: Internal Medicine

## 2012-02-13 NOTE — Telephone Encounter (Signed)
Pt called needs refill on  meloxicam   [facility] Pt has 2 pills left

## 2012-02-14 MED ORDER — MELOXICAM 15 MG PO TABS: 15.0000 mg | ORAL_TABLET | Freq: Every day | ORAL | Status: DC

## 2012-03-05 ENCOUNTER — Other Ambulatory Visit: Payer: Self-pay | Admitting: Internal Medicine

## 2012-03-05 NOTE — Telephone Encounter (Signed)
Pt called she is going to [place] for a while and needs her rx so she can take them with her in case she needs to get them refilled.  Please advise pt when she can pick these up  Amitriptyline Amlodipine atrovastatin Clorazepate Levothyroxine Lisinopril metoprolo

## 2012-03-06 ENCOUNTER — Other Ambulatory Visit (INDEPENDENT_AMBULATORY_CARE_PROVIDER_SITE_OTHER): Payer: 59 | Admitting: *Deleted

## 2012-03-06 DIAGNOSIS — N390 Urinary tract infection, site not specified: Secondary | ICD-10-CM

## 2012-03-06 LAB — POCT URINALYSIS DIPSTICK
Blood, UA: NEGATIVE
Glucose, UA: NEGATIVE
Nitrite, UA: NEGATIVE
Protein, UA: NEGATIVE
Urobilinogen, UA: 0.2

## 2012-03-06 MED ORDER — AMLODIPINE BESYLATE 10 MG PO TABS: 10.0000 mg | ORAL_TABLET | Freq: Every day | ORAL | Status: DC

## 2012-03-06 MED ORDER — CLORAZEPATE DIPOTASSIUM 7.5 MG PO TABS: 7.5000 mg | ORAL_TABLET | Freq: Every day | ORAL | Status: DC

## 2012-03-06 MED ORDER — LISINOPRIL 20 MG PO TABS: 20.0000 mg | ORAL_TABLET | Freq: Every day | ORAL | Status: DC

## 2012-03-06 MED ORDER — AMITRIPTYLINE HCL 50 MG PO TABS: 50.0000 mg | ORAL_TABLET | Freq: Every day | ORAL | Status: DC

## 2012-03-06 MED ORDER — ATORVASTATIN CALCIUM 40 MG PO TABS: 40.0000 mg | ORAL_TABLET | Freq: Every day | ORAL | Status: DC

## 2012-03-06 MED ORDER — LEVOTHYROXINE SODIUM 50 MCG PO TABS: 50.0000 ug | ORAL_TABLET | Freq: Every day | ORAL | Status: DC

## 2012-03-06 MED ORDER — METOPROLOL TARTRATE 50 MG PO TABS: 50.0000 mg | ORAL_TABLET | Freq: Two times a day (BID) | ORAL | Status: DC

## 2012-03-08 ENCOUNTER — Telehealth: Payer: Self-pay | Admitting: Internal Medicine

## 2012-03-08 MED ORDER — SULFAMETHOXAZOLE-TRIMETHOPRIM 800-160 MG PO TABS: 1.0000 | ORAL_TABLET | Freq: Two times a day (BID) | ORAL | Status: AC

## 2012-03-08 MED ORDER — SULFAMETHOXAZOLE-TRIMETHOPRIM 800-160 MG PO TABS: 1.0000 | ORAL_TABLET | Freq: Two times a day (BID) | ORAL | Status: DC

## 2012-03-08 NOTE — Telephone Encounter (Signed)
rx for septra sent to pharmacy

## 2012-03-08 NOTE — Telephone Encounter (Signed)
Patient notified

## 2012-03-08 NOTE — Addendum Note (Signed)
Addended by: Duncan Dull on: 03/08/2012 12:51 PM   Modules accepted: Orders

## 2012-03-10 LAB — URINE CULTURE: Colony Count: >=100000

## 2012-04-15 ENCOUNTER — Encounter: Payer: Self-pay | Admitting: Internal Medicine

## 2012-04-18 HISTORY — PX: CATARACT EXTRACTION, BILATERAL: SHX1313

## 2012-06-11 ENCOUNTER — Other Ambulatory Visit: Payer: Self-pay | Admitting: Internal Medicine

## 2012-06-11 MED ORDER — CLORAZEPATE DIPOTASSIUM 7.5 MG PO TABS: 7.5000 mg | ORAL_TABLET | Freq: Every day | ORAL | Status: DC

## 2012-06-11 NOTE — Telephone Encounter (Signed)
Pt is needing  Transzine 7.5 mg (generic). [facility] [address]

## 2012-06-11 NOTE — Telephone Encounter (Signed)
Ok to refill??      tranxene  

## 2012-07-06 ENCOUNTER — Emergency Department: Payer: Self-pay | Admitting: Emergency Medicine

## 2012-07-10 ENCOUNTER — Other Ambulatory Visit: Payer: Self-pay | Admitting: Internal Medicine

## 2012-07-11 NOTE — Telephone Encounter (Signed)
Ok to refill 

## 2012-07-17 ENCOUNTER — Encounter: Payer: Self-pay | Admitting: Internal Medicine

## 2012-07-17 ENCOUNTER — Ambulatory Visit (INDEPENDENT_AMBULATORY_CARE_PROVIDER_SITE_OTHER): Payer: 59 | Admitting: Internal Medicine

## 2012-07-17 VITALS — BP 140/76 | HR 95 | Temp 98.5°F | Ht 60.5 in | Wt 131.5 lb

## 2012-07-17 DIAGNOSIS — I70209 Unspecified atherosclerosis of native arteries of extremities, unspecified extremity: Secondary | ICD-10-CM

## 2012-07-17 DIAGNOSIS — D649 Anemia, unspecified: Secondary | ICD-10-CM

## 2012-07-17 DIAGNOSIS — N189 Chronic kidney disease, unspecified: Secondary | ICD-10-CM

## 2012-07-17 DIAGNOSIS — E785 Hyperlipidemia, unspecified: Secondary | ICD-10-CM

## 2012-07-17 DIAGNOSIS — I10 Essential (primary) hypertension: Secondary | ICD-10-CM

## 2012-07-17 LAB — CBC WITH DIFFERENTIAL/PLATELET
Basophils Relative: 0.6 % (ref 0.0–3.0)
Eosinophils Relative: 0.9 % (ref 0.0–5.0)
Hemoglobin: 11.9 g/dL — ABNORMAL LOW (ref 12.0–15.0)
Lymphocytes Relative: 30.8 % (ref 12.0–46.0)
Monocytes Relative: 8.5 % (ref 3.0–12.0)
Neutro Abs: 4.7 10*3/uL (ref 1.4–7.7)
Neutrophils Relative %: 59.2 % (ref 43.0–77.0)
RBC: 3.81 Mil/uL — ABNORMAL LOW (ref 3.87–5.11)
WBC: 7.9 10*3/uL (ref 4.5–10.5)

## 2012-07-17 LAB — COMPREHENSIVE METABOLIC PANEL
AST: 17 U/L (ref 0–37)
Albumin: 4.2 g/dL (ref 3.5–5.2)
BUN: 29 mg/dL — ABNORMAL HIGH (ref 6–23)
CO2: 30 mEq/L (ref 19–32)
Calcium: 10 mg/dL (ref 8.4–10.5)
Chloride: 102 mEq/L (ref 96–112)
Creatinine, Ser: 1 mg/dL (ref 0.4–1.2)
GFR: 57.47 mL/min — ABNORMAL LOW (ref >60.00)
Glucose, Bld: 97 mg/dL (ref 70–99)

## 2012-07-17 LAB — FERRITIN: Ferritin: 71.2 ng/mL (ref 10.0–291.0)

## 2012-07-17 LAB — IRON AND TIBC
%SAT: 23 % (ref 20–55)
TIBC: 476 ug/dL — ABNORMAL HIGH (ref 250–470)
UIBC: 365 ug/dL (ref 125–400)

## 2012-07-17 MED ORDER — LEVOTHYROXINE SODIUM 50 MCG PO TABS: 50.0000 ug | ORAL_TABLET | Freq: Every day | ORAL | Status: DC

## 2012-07-17 MED ORDER — AMLODIPINE BESYLATE 10 MG PO TABS: 10.0000 mg | ORAL_TABLET | Freq: Every day | ORAL | Status: DC

## 2012-07-17 MED ORDER — ESOMEPRAZOLE MAGNESIUM 40 MG PO CPDR: 40.0000 mg | DELAYED_RELEASE_CAPSULE | Freq: Every day | ORAL | Status: DC

## 2012-07-17 MED ORDER — ATORVASTATIN CALCIUM 40 MG PO TABS: 40.0000 mg | ORAL_TABLET | Freq: Every day | ORAL | Status: DC

## 2012-07-17 MED ORDER — LEVETIRACETAM 1000 MG PO TABS: 1000.0000 mg | ORAL_TABLET | Freq: Every day | ORAL | Status: DC

## 2012-07-17 MED ORDER — CLORAZEPATE DIPOTASSIUM 7.5 MG PO TABS: 7.5000 mg | ORAL_TABLET | Freq: Every day | ORAL | Status: DC

## 2012-07-17 MED ORDER — MELOXICAM 15 MG PO TABS: 15.0000 mg | ORAL_TABLET | Freq: Every day | ORAL | Status: DC

## 2012-07-17 MED ORDER — AMITRIPTYLINE HCL 50 MG PO TABS: 50.0000 mg | ORAL_TABLET | Freq: Every day | ORAL | Status: DC

## 2012-07-17 MED ORDER — LISINOPRIL 20 MG PO TABS: 20.0000 mg | ORAL_TABLET | Freq: Every day | ORAL | Status: DC

## 2012-07-17 MED ORDER — CHOLINE FENOFIBRATE 135 MG PO CPDR: 135.0000 mg | DELAYED_RELEASE_CAPSULE | Freq: Every day | ORAL | Status: DC

## 2012-07-17 NOTE — Progress Notes (Signed)
Patient ID: Leah Preston, female   DOB: December 01, 1930, 78 y.o.   MRN: [id number]  Patient Active Problem List  Diagnosis  . Hyperlipidemia  . Hypertension  . subdurah/subarachnoid hemorrhage  . OA (osteoarthritis of spine)  . coronary artery disease  . Screening for colon cancer  . Atherosclerotic peripheral vascular disease  . Peripheral vascular disease  . Cough  . Anemia    Subjective:  CC:   Chief Complaint  Patient presents with  . Medication Refill   HPI:   Leah Johnsonis a 78 y.o. female who presents for follow up on chronic issues including hypertension with history of hemorrhagic CVA, hyperlipidemia, and anemia. She is going to [facility] today  prescriptions on all of her many medications for the next 6  Months.  She has no specific complaints today,  In the interim since her last visit she has had  both cataracts recenlty with no complications.Her vision now excellent.  She reports some fatigue, but denies headaches,  Chest pain and has not had any falls since her last visit. Her bowels  are moving regularly and she denies any witnessing of blood in her stools. Her cough has  No seizure activity despite reducing her Keppra to once daily .     Past Medical History  Diagnosis Date  . Peripheral vascular disease     s/p carotid endarterectomy  . History of bladder problems   . Hypertension   . Hyperlipidemia   . OA (osteoarthritis of spine)     with chronic pain  . coronary artery disease   . subdurah/subarachnoid hemorrhage jun 2012    left parietal, sent to [facility]    Past Surgical History  Procedure Date  . Cesarean section   . Carotid endarterectomy jan 2012    left, complicated by large hematoma, evacuated  . Fracture surgery 2006    Left radial pinning  . Joint replacement     Left TKR 2010, [facility]  . Bladder suspension 2003    [facility]  . Cataract extraction, bilateral August 2013  . Abdominal hysterectomy 1968    menorrhagia         The following  portions of the patient's history were reviewed and updated as appropriate: Allergies, current medications, and problem list.    Review of Systems:   12 Pt  review of systems was negative except those addressed in the HPI,     History   Social History  . Marital Status: Single    Spouse Name: N/A    Number of Children: N/A  . Years of Education: N/A   Occupational History  . Not on file.   Social History Main Topics  . Smoking status: Former Smoker    Quit date: 06/07/1995  . Smokeless tobacco: Never Used  . Alcohol Use: No  . Drug Use: No  . Sexually Active: Not on file   Other Topics Concern  . Not on file   Social History Narrative   Lives alone after divorce in 18. 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 ROM normal. No CVA tenderness. Lungs: clear to auscultation bilaterally Heart: regular rate and rhythm, S1, S2 normal, no murmur, click, rub or gallop Abdomen: soft, non-tender; bowel sounds normal; no masses,  no organomegaly Pulses: 2+ and symmetric Skin: Skin color, texture, turgor normal. No rashes or lesions Lymph nodes: Cervical, supraclavicular, and axillary nodes normal.  Assessment and Plan:  Anemia Chronic, with prior GI workup in 2009 for heme positive stool including a normal colonosccopy and esophagitis and gastric  polyp.  B12 level was normal at last check. Ferritin, tibc and ifob ordered,    Atherosclerotic peripheral vascular disease She has deferred additional invasive procedures for noncritical stenoses per vascular.  6 month follow with them.  Continue current medicaitons.   Hypertension Well controlled on current regimen. Renal function stable, no changes today.  Hyperlipidemia Last fasting lipid panel was over one year ago  And LDL was < 70.  Unfortunately she is not fasting today.  LFts ordered. continue lipitor.    Updated Medication List Outpatient Encounter Prescriptions as of 07/17/2012  Medication Sig Dispense Refill  . amitriptyline (ELAVIL) 50 MG tablet Take 1 tablet (50 mg total) by mouth at bedtime.  90 tablet  2  . amLODipine (NORVASC) 10 MG tablet Take 1 tablet (10 mg total) by mouth daily.  90 tablet  2  . Ascorbic Acid (VITAMIN C PO) Take by mouth daily.        Marland Kitchen atorvastatin (LIPITOR) 40 MG tablet Take 1 tablet (40 mg total) by mouth daily.  90 tablet  2  . Choline Fenofibrate (TRILIPIX) 135 MG capsule Take 1 capsule (135 mg total) by mouth daily.  90 capsule  2  . clorazepate (TRANXENE) 7.5 MG tablet Take 1 tablet (7.5 mg total) by mouth daily.  30 tablet  3  . esomeprazole (NEXIUM) 40 MG capsule Take 1 capsule (40 mg total) by mouth daily before breakfast.  30 capsule  3  . levETIRAcetam (KEPPRA) 1000 MG tablet Take 1 tablet (1,000 mg total) by mouth daily.  90 tablet  1  . levothyroxine (SYNTHROID, LEVOTHROID) 50 MCG tablet Take 1 tablet (50 mcg total) by mouth daily.  90 tablet  1  . lisinopril (PRINIVIL,ZESTRIL) 20 MG tablet Take 1 tablet (20 mg total) by mouth daily.  90 tablet  2  . meloxicam (MOBIC) 15 MG tablet Take 1 tablet (15 mg total) by mouth daily.  90 tablet  3  . metoprolol (LOPRESSOR) 50 MG tablet Take 1 tablet (50 mg total) by mouth 2 (two) times daily.  180 tablet  2  . Multiple Vitamins-Minerals (CENTRUM SILVER PO) Take by mouth daily.        Marland Kitchen scopolamine  (TRANSDERM-SCOP) 1.5 MG Place 1 patch (1.5 mg total) onto the skin every 3 (three) days.  10 patch  0  . DISCONTD: amitriptyline (ELAVIL) 50 MG tablet Take 1 tablet (50 mg total) by mouth at bedtime.  90 tablet  2  . DISCONTD: amLODipine (NORVASC) 10 MG tablet Take 1 tablet (10 mg total) by mouth daily.  90 tablet  2  . DISCONTD: atorvastatin (LIPITOR) 40 MG tablet Take 1 tablet (40 mg total) by mouth daily.  90 tablet  2  . DISCONTD: clorazepate (TRANXENE) 7.5 MG tablet Take 1 tablet (7.5 mg total) by mouth  daily.  30 tablet  3  . DISCONTD: esomeprazole (NEXIUM) 40 MG capsule Take 1 capsule (40 mg total) by mouth daily before breakfast.  30 capsule  3  . DISCONTD: levETIRAcetam (KEPPRA) 1000 MG tablet Take 1,000 mg by mouth daily.      Marland Kitchen DISCONTD: levothyroxine (SYNTHROID, LEVOTHROID) 50 MCG tablet TAKE 1 TABLET DAILY  90 tablet  1  . DISCONTD: lisinopril (PRINIVIL,ZESTRIL) 20 MG tablet Take 1 tablet (20 mg total) by mouth daily.  90 tablet  2  . DISCONTD: meloxicam (MOBIC) 15 MG tablet Take 1 tablet (15 mg total) by mouth daily.  90 tablet  3  . DISCONTD: TRILIPIX 135 MG capsule TAKE 1 CAPSULE DAILY  90 capsule  2     Orders Placed This Encounter  Procedures  . Fecal occult blood, imunochemical  . HM MAMMOGRAPHY  . HM DEXA SCAN  . Comprehensive metabolic panel  . CBC with Differential  . Iron and TIBC  . Ferritin  . HM COLONOSCOPY    No Follow-up on file.

## 2012-07-17 NOTE — Patient Instructions (Addendum)
We are your hemoglobin and iron today.  Please complete the stool test and mail back to [place] , to check for blood in your stools.

## 2012-07-17 NOTE — Assessment & Plan Note (Addendum)
Chronic, with prior GI workup in 2009 for heme positive stool including a normal colonosccopy and esophagitis and gastric polyp.  B12 level was normal at last check. Ferritin, tibc and ifob ordered,

## 2012-07-19 ENCOUNTER — Encounter: Payer: Self-pay | Admitting: Internal Medicine

## 2012-07-19 NOTE — Assessment & Plan Note (Signed)
Last fasting lipid panel was over one year ago  And LDL was < 70.  Unfortunately she is not fasting today.  LFts ordered. continue lipitor.

## 2012-07-19 NOTE — Assessment & Plan Note (Signed)
Well controlled on current regimen. Renal function stable, no changes today. 

## 2012-07-19 NOTE — Assessment & Plan Note (Signed)
She has deferred additional invasive procedures for noncritical stenoses per vascular.  6 month follow with them.  Continue current medicaitons.

## 2012-07-23 ENCOUNTER — Encounter: Payer: Self-pay | Admitting: *Deleted

## 2012-07-23 NOTE — Progress Notes (Signed)
Result letter mailed to patient's home address.

## 2012-08-13 ENCOUNTER — Other Ambulatory Visit: Payer: 59

## 2012-08-13 ENCOUNTER — Other Ambulatory Visit (INDEPENDENT_AMBULATORY_CARE_PROVIDER_SITE_OTHER): Payer: 59 | Admitting: *Deleted

## 2012-08-13 DIAGNOSIS — D649 Anemia, unspecified: Secondary | ICD-10-CM

## 2012-08-14 LAB — FECAL OCCULT BLOOD, IMMUNOCHEMICAL: Fecal Occult Bld: NEGATIVE

## 2012-08-24 ENCOUNTER — Other Ambulatory Visit: Payer: Self-pay | Admitting: Internal Medicine

## 2012-09-13 ENCOUNTER — Other Ambulatory Visit: Payer: Self-pay | Admitting: Internal Medicine

## 2012-09-13 NOTE — Telephone Encounter (Signed)
Med filled.  

## 2012-09-26 ENCOUNTER — Other Ambulatory Visit: Payer: Self-pay | Admitting: Internal Medicine

## 2012-09-27 ENCOUNTER — Other Ambulatory Visit: Payer: Self-pay | Admitting: Internal Medicine

## 2012-09-28 NOTE — Telephone Encounter (Signed)
Med filed.  

## 2012-09-28 NOTE — Telephone Encounter (Signed)
Med filled.  

## 2012-11-07 ENCOUNTER — Telehealth: Payer: Self-pay | Admitting: Internal Medicine

## 2012-11-07 NOTE — Telephone Encounter (Signed)
Med filled.  

## 2012-11-07 NOTE — Telephone Encounter (Signed)
Ok to refill,  Authorized in epic 

## 2013-01-12 ENCOUNTER — Other Ambulatory Visit: Payer: Self-pay | Admitting: Internal Medicine

## 2013-01-13 ENCOUNTER — Other Ambulatory Visit: Payer: Self-pay | Admitting: Internal Medicine

## 2013-01-22 ENCOUNTER — Other Ambulatory Visit: Payer: Self-pay | Admitting: Internal Medicine

## 2013-01-22 ENCOUNTER — Other Ambulatory Visit: Payer: Self-pay | Admitting: *Deleted

## 2013-01-22 NOTE — Telephone Encounter (Signed)
OK to refill

## 2013-01-23 MED ORDER — CLORAZEPATE DIPOTASSIUM 7.5 MG PO TABS: 7.5000 mg | ORAL_TABLET | Freq: Every day | ORAL | Status: DC

## 2013-01-24 NOTE — Telephone Encounter (Signed)
Medication faxed to pharmacy as requested.  

## 2013-03-09 ENCOUNTER — Other Ambulatory Visit: Payer: Self-pay | Admitting: Internal Medicine

## 2013-03-12 ENCOUNTER — Other Ambulatory Visit: Payer: Self-pay | Admitting: Internal Medicine

## 2013-04-02 ENCOUNTER — Telehealth: Payer: Self-pay | Admitting: Internal Medicine

## 2013-04-02 DIAGNOSIS — Z79899 Other long term (current) drug therapy: Secondary | ICD-10-CM

## 2013-04-02 DIAGNOSIS — E039 Hypothyroidism, unspecified: Secondary | ICD-10-CM

## 2013-04-02 DIAGNOSIS — E785 Hyperlipidemia, unspecified: Secondary | ICD-10-CM

## 2013-04-02 NOTE — Telephone Encounter (Signed)
Last OV 10/13 please advise as to refill.

## 2013-04-02 NOTE — Telephone Encounter (Signed)
Pt is going out of town and is needing a written rx of Amitriptyline 50 mg, Trilipix 135 mg and Clorazetape 7.5 mg just in case she runs out when she's out of town.

## 2013-04-02 NOTE — Telephone Encounter (Signed)
She needs to have fasting labs before I can refill the trilipix,  and schedule an office visit ASAP .  Needs to be seen a minimum of 6 months and she is overdue.

## 2013-04-04 NOTE — Telephone Encounter (Signed)
Left message for patient to return call to office. 

## 2013-04-07 ENCOUNTER — Encounter: Payer: Self-pay | Admitting: Adult Health

## 2013-04-07 ENCOUNTER — Ambulatory Visit: Payer: 59 | Admitting: Adult Health

## 2013-04-07 ENCOUNTER — Ambulatory Visit (INDEPENDENT_AMBULATORY_CARE_PROVIDER_SITE_OTHER): Payer: 59 | Admitting: Adult Health

## 2013-04-07 VITALS — BP 140/62 | HR 71 | Temp 98.1°F | Resp 12 | Wt 136.0 lb

## 2013-04-07 DIAGNOSIS — Z79899 Other long term (current) drug therapy: Secondary | ICD-10-CM

## 2013-04-07 DIAGNOSIS — Z760 Encounter for issue of repeat prescription: Secondary | ICD-10-CM | POA: Insufficient documentation

## 2013-04-07 DIAGNOSIS — E785 Hyperlipidemia, unspecified: Secondary | ICD-10-CM

## 2013-04-07 MED ORDER — CLORAZEPATE DIPOTASSIUM 7.5 MG PO TABS: 7.5000 mg | ORAL_TABLET | Freq: Every day | ORAL | Status: DC

## 2013-04-07 NOTE — Assessment & Plan Note (Deleted)
Patient needs refill on trilipix. Instructed that we will need to do labs prior to refilling medication. She is not fasting; however, she will return in the morning prior to leaving for vacation to have fasting labs. I will reorder trilipix once lab results are available. Checking creatinine, LFTs and lipid panel.

## 2013-04-07 NOTE — Progress Notes (Signed)
  Subjective:    Patient ID: Leah Preston, female    DOB: 1931/04/01, 79 y.o.   MRN: [id number]  HPI  Patient is a pleasant 79 year old female with history of MI, CVA, peripheral vascular disease, hypertension hyperlipidemia who presents to clinic for medication refills prior to going on vacation. She is requesting refill on her clorazepate and trilipix. She is feeling well overall. No concerns this visit.  Current Outpatient Prescriptions on File Prior to Visit  Medication Sig Dispense Refill  . amitriptyline (ELAVIL) 50 MG tablet TAKE 1 TABLET DAILY  90 tablet  0  . amLODipine (NORVASC) 10 MG tablet TAKE 1 TABLET DAILY  90 tablet  0  . Ascorbic Acid (VITAMIN C PO) Take by mouth daily.        Marland Kitchen atorvastatin (LIPITOR) 40 MG tablet TAKE 1 TABLET DAILY  90 tablet  0  . Choline Fenofibrate (FENOFIBRIC ACID) 135 MG CPDR TAKE 1 CAPSULE DAILY  90 capsule  0  . levothyroxine (SYNTHROID, LEVOTHROID) 50 MCG tablet TAKE 1 TABLET DAILY  90 tablet  0  . lisinopril (PRINIVIL,ZESTRIL) 20 MG tablet TAKE 1 TABLET DAILY  90 tablet  3  . meloxicam (MOBIC) 15 MG tablet TAKE 1 TABLET DAILY  90 tablet  2  . metoprolol (LOPRESSOR) 50 MG tablet TAKE 1 TABLET TWICE A DAY  180 tablet  2  . Multiple Vitamins-Minerals (CENTRUM SILVER PO) Take by mouth daily.         No current facility-administered medications on file prior to visit.    Review of Systems  Constitutional: Negative.   HENT: Negative.   Respiratory: Negative.   Cardiovascular: Negative.   Gastrointestinal: Negative.   Genitourinary: Negative.   Musculoskeletal:       Arthritis  Neurological: Negative.   Psychiatric/Behavioral: Negative.    BP 140/62  Pulse 71  Temp(Src) 98.1 F (36.7 C) (Oral)  Resp 12  Wt 136 lb (61.689 kg)  BMI 26.11 kg/m2  SpO2 97%    Objective:   Physical Exam  Constitutional: She is oriented to person, place, and time. She appears well-developed and well-nourished. No distress.  Cardiovascular: Normal rate,  regular rhythm and normal heart sounds.  Exam reveals no gallop and no friction rub.   No murmur heard. Pulmonary/Chest: Effort normal and breath sounds normal. No respiratory distress. She has no wheezes. She has no rales.  Neurological: She is alert and oriented to person, place, and time.  Speech is slow; however, intelligible   Skin: Skin is warm and dry.  Psychiatric: She has a normal mood and affect. Her behavior is normal. Judgment and thought content normal.       Assessment & Plan:

## 2013-04-07 NOTE — Telephone Encounter (Addendum)
Spoke with patient, she is getting ready to go out of town. She is leaving Wednesday morning and will not be back until September. But she has been scheduled to see Raquel today at 3:00

## 2013-04-07 NOTE — Assessment & Plan Note (Signed)
Patient needs refill on clorazepate. She is planning on leaving for vacation tomorrow morning and is requesting a hard copy so that she could fill prescription in [place]. Prescription provided. Patient also needs refill on trilipix. Instructed that we will need to do labs prior to refilling this medication. She is not fasting; however, she will return in the morning prior to leaving for vacation to have fasting labs. I will reorder trilipix once lab results are available. Checking creatinine, LFTs and lipid panel.

## 2013-04-07 NOTE — Patient Instructions (Addendum)
  Please return in the morning for fasting labs.  I have provided you with a prescription for Clorazepate to take with you on your trip.  Enjoy the [place]!!

## 2013-04-08 ENCOUNTER — Other Ambulatory Visit (INDEPENDENT_AMBULATORY_CARE_PROVIDER_SITE_OTHER): Payer: 59

## 2013-04-08 DIAGNOSIS — Z79899 Other long term (current) drug therapy: Secondary | ICD-10-CM

## 2013-04-08 DIAGNOSIS — E785 Hyperlipidemia, unspecified: Secondary | ICD-10-CM

## 2013-04-08 DIAGNOSIS — E039 Hypothyroidism, unspecified: Secondary | ICD-10-CM

## 2013-04-08 LAB — COMPREHENSIVE METABOLIC PANEL
AST: 25 U/L (ref 0–37)
Albumin: 4.3 g/dL (ref 3.5–5.2)
BUN: 30 mg/dL — ABNORMAL HIGH (ref 6–23)
CO2: 24 mEq/L (ref 19–32)
Calcium: 10.3 mg/dL (ref 8.4–10.5)
Chloride: 106 mEq/L (ref 96–112)
Glucose, Bld: 115 mg/dL — ABNORMAL HIGH (ref 70–99)
Potassium: 4.8 mEq/L (ref 3.5–5.1)

## 2013-04-08 LAB — LDL CHOLESTEROL, DIRECT
Direct LDL: 74.2 mg/dL
Direct LDL: 75.4 mg/dL

## 2013-04-08 LAB — TSH: TSH: 1.27 u[IU]/mL (ref 0.35–5.50)

## 2013-04-08 LAB — HEPATIC FUNCTION PANEL
AST: 25 U/L (ref 0–37)
Albumin: 4.2 g/dL (ref 3.5–5.2)
Alkaline Phosphatase: 39 U/L (ref 39–117)

## 2013-04-08 LAB — LIPID PANEL
Total CHOL/HDL Ratio: 4
Triglycerides: 353 mg/dL — ABNORMAL HIGH (ref 0.0–149.0)

## 2013-04-14 ENCOUNTER — Telehealth: Payer: Self-pay | Admitting: Internal Medicine

## 2013-04-14 NOTE — Telephone Encounter (Signed)
Needing labs faxed to [facility] in [place] fax # [phone] phone# [phone]. Please call patient.

## 2013-04-15 NOTE — Telephone Encounter (Signed)
Patient was advised of labs and patient is to schedule an appointment with Dr. Darrick Huntsman when she returns from vacation in [place]

## 2013-04-30 ENCOUNTER — Telehealth: Payer: Self-pay | Admitting: Internal Medicine

## 2013-04-30 DIAGNOSIS — Z862 Personal history of diseases of the blood and blood-forming organs and certain disorders involving the immune mechanism: Secondary | ICD-10-CM

## 2013-04-30 DIAGNOSIS — E559 Vitamin D deficiency, unspecified: Secondary | ICD-10-CM

## 2013-04-30 DIAGNOSIS — Z79899 Other long term (current) drug therapy: Secondary | ICD-10-CM

## 2013-04-30 DIAGNOSIS — E785 Hyperlipidemia, unspecified: Secondary | ICD-10-CM

## 2013-04-30 NOTE — Telephone Encounter (Signed)
Pt coming in 9/24 for f/u appt.  Pt asking if she can come in for blood work to include triglycerides, pt states this had risen at last check and she would like a recheck.  Lab appt scheduled for 9/23.  Please add orders needed.  Pt is in [place] and will not be back in town until 9/22.

## 2013-05-01 NOTE — Telephone Encounter (Signed)
orfdered

## 2013-06-07 ENCOUNTER — Other Ambulatory Visit: Payer: Self-pay | Admitting: Internal Medicine

## 2013-06-09 ENCOUNTER — Other Ambulatory Visit (INDEPENDENT_AMBULATORY_CARE_PROVIDER_SITE_OTHER): Payer: 59

## 2013-06-09 DIAGNOSIS — Z862 Personal history of diseases of the blood and blood-forming organs and certain disorders involving the immune mechanism: Secondary | ICD-10-CM

## 2013-06-09 DIAGNOSIS — E785 Hyperlipidemia, unspecified: Secondary | ICD-10-CM

## 2013-06-09 DIAGNOSIS — Z79899 Other long term (current) drug therapy: Secondary | ICD-10-CM

## 2013-06-09 DIAGNOSIS — E559 Vitamin D deficiency, unspecified: Secondary | ICD-10-CM

## 2013-06-09 DIAGNOSIS — N390 Urinary tract infection, site not specified: Secondary | ICD-10-CM

## 2013-06-09 LAB — COMPREHENSIVE METABOLIC PANEL
AST: 24 U/L (ref 0–37)
Alkaline Phosphatase: 32 U/L — ABNORMAL LOW (ref 39–117)
BUN: 34 mg/dL — ABNORMAL HIGH (ref 6–23)
Calcium: 9.9 mg/dL (ref 8.4–10.5)
Creatinine, Ser: 1.3 mg/dL — ABNORMAL HIGH (ref 0.4–1.2)
Glucose, Bld: 122 mg/dL — ABNORMAL HIGH (ref 70–99)
Potassium: 4.2 mEq/L (ref 3.5–5.1)

## 2013-06-09 LAB — POCT URINALYSIS DIPSTICK
Bilirubin, UA: NEGATIVE
Glucose, UA: NEGATIVE
Nitrite, UA: NEGATIVE
Protein, UA: NEGATIVE
Spec Grav, UA: 1.01
Urobilinogen, UA: 0.2

## 2013-06-09 LAB — CBC WITH DIFFERENTIAL/PLATELET
Basophils Absolute: 0.1 10*3/uL (ref 0.0–0.1)
Eosinophils Absolute: 0.3 10*3/uL (ref 0.0–0.7)
HCT: 34.3 % — ABNORMAL LOW (ref 36.0–46.0)
Lymphocytes Relative: 27.7 % (ref 12.0–46.0)
Lymphs Abs: 2.3 10*3/uL (ref 0.7–4.0)
MCHC: 33.5 g/dL (ref 30.0–36.0)
Monocytes Relative: 7.8 % (ref 3.0–12.0)
Neutro Abs: 5 10*3/uL (ref 1.4–7.7)
Platelets: 221 10*3/uL (ref 150.0–400.0)
RBC: 3.65 Mil/uL — ABNORMAL LOW (ref 3.87–5.11)
RDW: 13.2 % (ref 11.5–14.6)

## 2013-06-09 LAB — LIPID PANEL
Cholesterol: 165 mg/dL (ref 0–200)
HDL: 45.5 mg/dL (ref >39.00)
Total CHOL/HDL Ratio: 4
Triglycerides: 339 mg/dL — ABNORMAL HIGH (ref 0.0–149.0)

## 2013-06-09 LAB — LDL CHOLESTEROL, DIRECT: Direct LDL: 76.1 mg/dL

## 2013-06-09 NOTE — Addendum Note (Signed)
Addended by: Montine Circle D on: 06/09/2013 10:13 AM   Modules accepted: Orders

## 2013-06-10 ENCOUNTER — Other Ambulatory Visit: Payer: 59

## 2013-06-10 LAB — VITAMIN D 25 HYDROXY (VIT D DEFICIENCY, FRACTURES): Vit D, 25-Hydroxy: 31 ng/mL (ref 30–89)

## 2013-06-11 ENCOUNTER — Ambulatory Visit: Payer: 59 | Admitting: Internal Medicine

## 2013-06-11 DIAGNOSIS — N390 Urinary tract infection, site not specified: Secondary | ICD-10-CM | POA: Insufficient documentation

## 2013-06-11 MED ORDER — CIPROFLOXACIN HCL 250 MG PO TABS: 250.0000 mg | ORAL_TABLET | Freq: Two times a day (BID) | ORAL | Status: DC

## 2013-06-11 NOTE — Addendum Note (Signed)
Addended by: Sherlene Shams on: 06/11/2013 07:05 AM   Modules accepted: Orders

## 2013-06-12 ENCOUNTER — Encounter: Payer: Self-pay | Admitting: Internal Medicine

## 2013-06-12 ENCOUNTER — Ambulatory Visit: Payer: 59 | Admitting: Internal Medicine

## 2013-06-12 ENCOUNTER — Ambulatory Visit (INDEPENDENT_AMBULATORY_CARE_PROVIDER_SITE_OTHER): Payer: Medicare Other | Admitting: Internal Medicine

## 2013-06-12 VITALS — BP 130/58 | HR 76 | Temp 98.1°F | Resp 14 | Ht 60.5 in | Wt 136.5 lb

## 2013-06-12 DIAGNOSIS — Z1239 Encounter for other screening for malignant neoplasm of breast: Secondary | ICD-10-CM

## 2013-06-12 DIAGNOSIS — R7309 Other abnormal glucose: Secondary | ICD-10-CM

## 2013-06-12 DIAGNOSIS — Z23 Encounter for immunization: Secondary | ICD-10-CM

## 2013-06-12 DIAGNOSIS — N390 Urinary tract infection, site not specified: Secondary | ICD-10-CM

## 2013-06-12 DIAGNOSIS — Z1211 Encounter for screening for malignant neoplasm of colon: Secondary | ICD-10-CM

## 2013-06-12 DIAGNOSIS — Z0000 Encounter for general adult medical examination without abnormal findings: Secondary | ICD-10-CM

## 2013-06-12 DIAGNOSIS — E785 Hyperlipidemia, unspecified: Secondary | ICD-10-CM

## 2013-06-12 DIAGNOSIS — R739 Hyperglycemia, unspecified: Secondary | ICD-10-CM

## 2013-06-12 DIAGNOSIS — I10 Essential (primary) hypertension: Secondary | ICD-10-CM

## 2013-06-12 MED ORDER — CIPROFLOXACIN HCL 250 MG PO TABS: 250.0000 mg | ORAL_TABLET | Freq: Two times a day (BID) | ORAL | Status: DC

## 2013-06-12 NOTE — Progress Notes (Signed)
Patient ID: Leah Preston, female   DOB: 19-Aug-1931, 79 y.o.   MRN: [id number]  The patient is here for annual Medicare wellness examination and management of other chronic and acute problems. including hyperlipidemia hypertension and CVD with priro hemorrhagic stroke.  Her speech still a bit slurred, but she has not had any falls in a year.  Has been out of town in [place].  Headed to [facility] in October. Has a UTI  But has not started abx yet.  No flank pain nausea or fevers. . Persistent insomnia not responding to tranxene  .  Occurs every night     The risk factors are reflected in the social history.  The roster of all physicians providing medical care to patient - is listed in the Snapshot section of the chart.  Activities of daily living:  The patient is 100% independent in all ADLs: dressing, toileting, feeding as well as independent mobility  Home safety : The patient has smoke detectors in the home. They wear seatbelts.  There are no firearms at home. There is no violence in the home.   There is no risks for hepatitis, STDs or HIV. There is no   history of blood transfusion. They have no travel history to infectious disease endemic areas of the world.  The patient has seen their dentist in the last six month. They have seen their eye doctor in the last year. They admit to slight hearing difficulty with regard to whispered voices and some television programs.  They have deferred audiologic testing in the last year.  They do not  have excessive sun exposure. Discussed the need for sun protection: hats, long sleeves and use of sunscreen if there is significant sun exposure.   Diet: the importance of a healthy diet is discussed. They do have a healthy diet.  The benefits of regular aerobic exercise were discussed. She walks 4 times per week ,  20 minutes.   Depression screen: there are no signs or vegative symptoms of depression- irritability, change in appetite, anhedonia,  sadness/tearfullness.  Cognitive assessment: the patient manages all their financial and personal affairs and is actively engaged. They could relate day,date,year and events; recalled 2/3 objects at 3 minutes; performed clock-face test normally.  The following portions of the patient's history were reviewed and updated as appropriate: allergies, current medications, past family history, past medical history,  past surgical history, past social history  and problem list.  Visual acuity was not assessed per patient preference since she has regular follow up with her ophthalmologist. Hearing and body mass index were assessed and reviewed.   During the course of the visit the patient was educated and counseled about appropriate screening and preventive services including : fall prevention , diabetes screening, nutrition counseling, colorectal cancer screening, and recommended immunizations.    Objective:  BP 130/58  Pulse 76  Temp(Src) 98.1 F (36.7 C) (Oral)  Resp 14  Ht 5' 0.5" (1.537 m)  Wt 136 lb 8 oz (61.916 kg)  BMI 26.21 kg/m2  SpO2 98%  General Appearance:    Alert, cooperative, no distress, appears stated age  Head:    Normocephalic, without obvious abnormality, atraumatic  Eyes:    PERRL, conjunctiva/corneas clear, EOM's intact, fundi    benign, both eyes  Ears:    Normal TM's and external ear canals, both ears  Nose:   Nares normal, septum midline, mucosa normal, no drainage    or sinus tenderness  Throat:   Lips, mucosa, and tongue  normal; teeth and gums normal  Neck:   Supple, symmetrical, trachea midline, no adenopathy;    thyroid:  no enlargement/tenderness/nodules; no carotid   bruit or JVD  Back:     Symmetric, no curvature, ROM normal, no CVA tenderness  Lungs:     Clear to auscultation bilaterally, respirations unlabored  Chest Wall:    No tenderness or deformity   Heart:    Regular rate and rhythm, S1 and S2 normal, no murmur, rub   or gallop  Breast Exam:    No  tenderness, masses, or nipple abnormality  Abdomen:     Soft, non-tender, bowel sounds active all four quadrants,    no masses, no organomegaly     Extremities:   Extremities normal, atraumatic, no cyanosis or edema  Pulses:   2+ and symmetric all extremities  Skin:   Skin color, texture, turgor normal, no rashes or lesions  Lymph nodes:   Cervical, supraclavicular, and axillary nodes normal  Neurologic:   CNII-XII intact, normal strength, sensation and reflexes    throughout   Assessment and Plan:  Hyperlipidemia Her triglycerides were 339,  LDL at goal in current dose of atorvastatin.   Discussed need to limit starches and c=alcohol and follow a low GI diet .   Hypertension Well controlled on current regimen. Renal function  Is slightly lower,  Will have her return to check in a nonfasting state.   UTI (urinary tract infection) UTI secondary to E Coli, sensitive to Cipro.  rx sent to pharmacy   Hyperglycemia Fasting glucose was 122.  hgba1c was subsequently checked and high at 6.5 Low gycemic index diet discussed with patient today   Routine general medical examination at a health care facility Annual comprehensive exam was done including breast, without pelvic exam. All screenings have been addressed .    Updated Medication List Outpatient Encounter Prescriptions as of 06/12/2013  Medication Sig Dispense Refill  . amitriptyline (ELAVIL) 50 MG tablet TAKE 1 TABLET DAILY  90 tablet  0  . amLODipine (NORVASC) 10 MG tablet TAKE 1 TABLET DAILY  90 tablet  0  . Ascorbic Acid (VITAMIN C PO) Take by mouth daily.        Marland Kitchen atorvastatin (LIPITOR) 40 MG tablet TAKE 1 TABLET DAILY  90 tablet  0  . Choline Fenofibrate (FENOFIBRIC ACID) 135 MG CPDR TAKE 1 CAPSULE DAILY  90 capsule  0  . clorazepate (TRANXENE) 7.5 MG tablet Take 1 tablet (7.5 mg total) by mouth daily.  30 tablet  3  . levETIRAcetam (KEPPRA) 1000 MG tablet       . levothyroxine (SYNTHROID, LEVOTHROID) 50 MCG tablet TAKE 1  TABLET DAILY  90 tablet  1  . lisinopril (PRINIVIL,ZESTRIL) 20 MG tablet TAKE 1 TABLET DAILY  90 tablet  3  . meloxicam (MOBIC) 15 MG tablet TAKE 1 TABLET DAILY  90 tablet  2  . metoprolol (LOPRESSOR) 50 MG tablet TAKE 1 TABLET TWICE A DAY  180 tablet  2  . Multiple Vitamins-Minerals (CENTRUM SILVER PO) Take by mouth daily.        . ciprofloxacin (CIPRO) 250 MG tablet Take 1 tablet (250 mg total) by mouth 2 (two) times daily.  6 tablet  0  . [DISCONTINUED] ciprofloxacin (CIPRO) 250 MG tablet Take 1 tablet (250 mg total) by mouth 2 (two) times daily.  6 tablet  0   No facility-administered encounter medications on file as of 06/12/2013.

## 2013-06-12 NOTE — Patient Instructions (Addendum)
You have a urinary tract infecction,  Take the cipro I sent to your pharmacy   You received the flu vaccine today  Your cholesterol and your blood sugars are too high.  You are at risk for developing diabetes,  So I am checking today  You need to follow a low glycemic index diet  (low "GI")  Here are a few basic points about changing your diet :   Avoid baked potates and french fries  Ask for the steamed or grilled vegetables instead  Avid bagels, muffins, cereal  and cinnamon rolls for breakfast Choose eggs ,  Tomasa Blase and low carb toast instead  Snack on sweet peppers,  Fresh berries or cherries  Or cheese instead of crackers, pretzels, and potato chips, and watermelon   Avoid seafood or chicken that has BREADING .  Avoid dessert except on weekends.

## 2013-06-14 ENCOUNTER — Encounter: Payer: Self-pay | Admitting: Internal Medicine

## 2013-06-14 DIAGNOSIS — R7301 Impaired fasting glucose: Secondary | ICD-10-CM | POA: Insufficient documentation

## 2013-06-14 DIAGNOSIS — Z0000 Encounter for general adult medical examination without abnormal findings: Secondary | ICD-10-CM | POA: Insufficient documentation

## 2013-06-14 NOTE — Assessment & Plan Note (Signed)
Annual comprehensive exam was done including breast, without pelvic exam. All screenings have been addressed .  

## 2013-06-14 NOTE — Assessment & Plan Note (Signed)
Her triglycerides were 339,  LDL fine.  Discussed need to limit starches and c=alcohol and follow a low GI diet .

## 2013-06-14 NOTE — Assessment & Plan Note (Addendum)
Well controlled on current regimen. Renal function  Is slightly lower,  Will have her return to check in a nonfasting state.

## 2013-06-14 NOTE — Assessment & Plan Note (Signed)
UTI secondary to E Coli, sensitive to Cipro.  rx sent to pharmacy

## 2013-06-14 NOTE — Assessment & Plan Note (Signed)
Fasting glucose was 122.  hgba1c was subseuqently checked and high at 6.5 Low gycemic index diet discussed with patient today

## 2013-06-24 ENCOUNTER — Ambulatory Visit: Payer: Self-pay | Admitting: Internal Medicine

## 2013-06-24 ENCOUNTER — Other Ambulatory Visit (INDEPENDENT_AMBULATORY_CARE_PROVIDER_SITE_OTHER): Payer: 59

## 2013-06-24 DIAGNOSIS — Z1211 Encounter for screening for malignant neoplasm of colon: Secondary | ICD-10-CM

## 2013-06-24 LAB — HM MAMMOGRAPHY

## 2013-06-24 LAB — FECAL OCCULT BLOOD, IMMUNOCHEMICAL: Fecal Occult Bld: NEGATIVE

## 2013-06-25 ENCOUNTER — Other Ambulatory Visit: Payer: Self-pay | Admitting: Internal Medicine

## 2013-06-26 ENCOUNTER — Encounter: Payer: Self-pay | Admitting: *Deleted

## 2013-06-30 ENCOUNTER — Encounter: Payer: Self-pay | Admitting: Internal Medicine

## 2013-06-30 ENCOUNTER — Ambulatory Visit (INDEPENDENT_AMBULATORY_CARE_PROVIDER_SITE_OTHER): Payer: 59 | Admitting: Internal Medicine

## 2013-06-30 VITALS — BP 120/64 | HR 77 | Temp 97.5°F | Ht 59.75 in | Wt 134.0 lb

## 2013-06-30 DIAGNOSIS — E785 Hyperlipidemia, unspecified: Secondary | ICD-10-CM

## 2013-06-30 DIAGNOSIS — E119 Type 2 diabetes mellitus without complications: Secondary | ICD-10-CM

## 2013-06-30 MED ORDER — LISINOPRIL 20 MG PO TABS: ORAL_TABLET | ORAL | Status: DC

## 2013-06-30 MED ORDER — LEVETIRACETAM 1000 MG PO TABS: 1000.0000 mg | ORAL_TABLET | Freq: Two times a day (BID) | ORAL | Status: DC

## 2013-06-30 MED ORDER — FENOFIBRIC ACID 135 MG PO CPDR: 1.0000 | DELAYED_RELEASE_CAPSULE | Freq: Every day | ORAL | Status: DC

## 2013-06-30 MED ORDER — CLORAZEPATE DIPOTASSIUM 7.5 MG PO TABS: 7.5000 mg | ORAL_TABLET | Freq: Every day | ORAL | Status: DC

## 2013-06-30 MED ORDER — METOPROLOL TARTRATE 50 MG PO TABS: ORAL_TABLET | ORAL | Status: DC

## 2013-06-30 MED ORDER — GLUCOSE BLOOD VI STRP: ORAL_STRIP | Status: DC

## 2013-06-30 NOTE — Patient Instructions (Signed)
Please  follow a low glycemic index as much as possible while yo are down in [place]   We will repeat your blood work in April when you return ----------------------------------------------------------------------------------------------------------------- Check your blood sugars one in a while to make sure you are not "losing control"  Normal blood sugars (or "well controlled" diabetes blood sugars ) are:  Between 80 and 125 in the morning before you eat anything ("fasting")  Less than 150 when checked  2 hours after a meal ("Post prandial")  Avoiding starches like potatoes, rice, breaded meats,  Desserts and dinner rolls will help  Continue all of your current medications

## 2013-07-01 NOTE — Assessment & Plan Note (Signed)
Lab Results  Component Value Date   HGBA1C 6.5 06/12/2013    New onset, no medication needed.  Glucometer given and use demonstrated.  .Low glycemic index diet discussed and advised.

## 2013-07-01 NOTE — Assessment & Plan Note (Signed)
Triglycerides are > 300 despite reported compliance with fenofibrate. No changes today  Except to emphasize low glycemic index diet

## 2013-07-01 NOTE — Progress Notes (Signed)
Patient ID: Leah Preston, female   DOB: 07/17/1931, 79 y.o.   MRN: [id number] Patient Active Problem List   Diagnosis Date Noted  . Hyperglycemia 06/14/2013  . Routine general medical examination at a health care facility 06/14/2013  . UTI (urinary tract infection) 06/11/2013  . Encounter for medication refill 04/07/2013  . Cough 01/10/2012  . Anemia 01/10/2012  . Peripheral vascular disease   . Screening for colon cancer 06/10/2011  . Atherosclerotic peripheral vascular disease 06/10/2011  . Hyperlipidemia   . Hypertension   . OA (osteoarthritis of spine)   . coronary artery disease   . subdurah/subarachnoid hemorrhage 02/17/2011    Subjective:  CC:   Chief Complaint  Patient presents with  . Follow-up    Discuss blood work     HPI:   Leah Johnsonis a 79 y.o. female who presents for follow up on chronic conditions including new onset diabetes.  She has modified her diet since last visit and eliminated potatoes and white bread.  She is headed to [facility] next week for the winter.    Past Medical History  Diagnosis Date  . Peripheral vascular disease     s/p carotid endarterectomy  . History of bladder problems   . Hypertension   . Hyperlipidemia   . OA (osteoarthritis of spine)     with chronic pain  . coronary artery disease   . subdurah/subarachnoid hemorrhage jun 2012    left parietal, sent to [facility]    Past Surgical History  Procedure Laterality Date  . Cesarean section    . Carotid endarterectomy  jan 2012    left, complicated by large hematoma, evacuated  . Fracture surgery  2006    Left radial pinning  . Joint replacement      Left TKR 2010, [facility]  . Bladder suspension  2003    [facility]  . Cataract extraction, bilateral  August 2013  . Abdominal hysterectomy  1968    menorrhagia       The following portions of the patient's history were reviewed and updated as appropriate: Allergies, current medications, and problem list.    Review of Systems:   12 Pt   review of systems was negative except those addressed in the HPI,     History   Social History  . Marital Status: Single    Spouse Name: N/A    Number of Children: N/A  . Years of Education: N/A   Occupational History  . Not on file.   Social History Main Topics  . Smoking status: Former Smoker    Quit date: 06/07/1995  . Smokeless tobacco: Never Used  . Alcohol Use: No  . Drug Use: No  . Sexual Activity: Not on file   Other Topics Concern  . Not on file   Social History Narrative   Lives alone after divorce in 69. Daughter in unemployed and living with her since August 2008 (and granddaughter, 45).      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 ROM normal. No CVA tenderness. Lungs: clear to auscultation bilaterally Heart: regular rate and rhythm, S1, S2 normal, no murmur, click, rub or gallop Abdomen: soft, non-tender; bowel sounds normal; no masses,  no organomegaly Pulses: 2+ and symmetric Skin: Skin color, texture, turgor normal. No rashes or lesions Lymph nodes: Cervical, supraclavicular, and axillary nodes normal.  Assessment and Plan:  Diabetes mellitus, new onset Lab Results  Component Value Date   HGBA1C 6.5 06/12/2013    New onset, no medication needed.  Glucometer given and use demonstrated.  .Low glycemic index diet discussed and advised.   Hyperlipidemia Triglycerides are > 300 despite reported compliance with fenofibrate. No changes today  Except  to emphasize low glycemic index diet   Updated Medication List Outpatient Encounter Prescriptions as of 06/30/2013  Medication Sig Dispense Refill  . amitriptyline (ELAVIL) 50 MG tablet TAKE 1 TABLET DAILY  90 tablet  0  . amLODipine (NORVASC) 10 MG tablet TAKE 1 TABLET DAILY  90 tablet  0  . Ascorbic Acid (VITAMIN C PO) Take by mouth daily.        Marland Kitchen atorvastatin (LIPITOR) 40 MG tablet TAKE 1 TABLET DAILY  90 tablet  0  . Choline Fenofibrate (FENOFIBRIC ACID) 135 MG CPDR Take 1 capsule by mouth at bedtime.  90 capsule  1  . clorazepate (TRANXENE) 7.5 MG tablet Take 1 tablet (7.5 mg total) by mouth daily.  90 tablet  3  . levETIRAcetam (KEPPRA) 1000 MG tablet Take 1 tablet (1,000 mg total) by mouth 2 (two) times daily.  180 tablet  3  . levothyroxine (SYNTHROID, LEVOTHROID) 50 MCG tablet TAKE 1 TABLET DAILY  90 tablet  1  . lisinopril (PRINIVIL,ZESTRIL) 20 MG tablet TAKE 1 TABLET DAILY  90 tablet  3  . meloxicam (MOBIC) 15 MG tablet TAKE 1 TABLET DAILY  90 tablet  2  . metoprolol (LOPRESSOR) 50 MG tablet TAKE 1 TABLET TWICE A DAY  180 tablet  2  . Multiple Vitamins-Minerals (CENTRUM SILVER PO) Take by mouth daily.        . [DISCONTINUED] Choline Fenofibrate (FENOFIBRIC ACID) 135 MG CPDR TAKE 1 CAPSULE DAILY  90 capsule  0  . [DISCONTINUED] clorazepate (TRANXENE) 7.5 MG tablet Take 1 tablet (7.5 mg total) by mouth daily.  30 tablet  3  . [DISCONTINUED] levETIRAcetam (KEPPRA) 1000 MG tablet       . [DISCONTINUED] lisinopril (PRINIVIL,ZESTRIL) 20 MG tablet TAKE 1 TABLET DAILY  90 tablet  3  . [DISCONTINUED] metoprolol (LOPRESSOR) 50 MG tablet TAKE 1 TABLET TWICE A DAY  180 tablet  2  . glucose blood test strip Use as instructed to check blood sugars once daily  250.00  100 each  12  . [DISCONTINUED] ciprofloxacin (CIPRO) 250 MG tablet Take 1 tablet (250 mg total) by mouth 2 (two) times daily.  6 tablet  0   No facility-administered encounter medications on file as of 06/30/2013.

## 2013-07-02 ENCOUNTER — Telehealth: Payer: Self-pay | Admitting: Internal Medicine

## 2013-07-02 NOTE — Telephone Encounter (Signed)
Called [facility], patient rep said Rxs were just received yesterday, and pharmacist had not yet tried to contact office. She put me on hold to speak to the pharmacy, but was left on hold 10 minutes and had to hang up. Called pt and left message to call back to update her.

## 2013-07-02 NOTE — Telephone Encounter (Signed)
States [facility] has [facility] her twice with questions about her medications, states one of them is wrong.  Pt said they would not give her information on which medication was wrong or what the problem was but told her they had been trying to get in touch with [place].  Asking if we can [facility] to get this straightened out.

## 2013-07-04 ENCOUNTER — Telehealth: Payer: Self-pay | Admitting: *Deleted

## 2013-07-04 NOTE — Telephone Encounter (Signed)
Refill Request  Contour Next Test Strips 100s  Use as intrusted to cheek blood sugar once daily

## 2013-07-09 NOTE — Telephone Encounter (Signed)
Tried to call patient phone rang and went to a busy signal

## 2013-07-09 NOTE — Telephone Encounter (Signed)
Patient says she still has not received medication so she is going to [facility]

## 2013-07-31 ENCOUNTER — Telehealth: Payer: Self-pay | Admitting: Internal Medicine

## 2013-07-31 DIAGNOSIS — E119 Type 2 diabetes mellitus without complications: Secondary | ICD-10-CM

## 2013-07-31 NOTE — Telephone Encounter (Signed)
Her insurance will not cover  The [employer] glucometer anymore. We have to switch to One touch.  Does she want [place] to [facility] it [facility] now?

## 2013-08-01 NOTE — Telephone Encounter (Signed)
Left message for patient to return call to office. 

## 2013-08-01 NOTE — Telephone Encounter (Signed)
Patient in [place] for the winter months stated it would be fine to send for one touch glucometer,

## 2013-08-01 NOTE — Telephone Encounter (Signed)
RX for one touch meter sent to mail order

## 2013-09-05 ENCOUNTER — Other Ambulatory Visit: Payer: Self-pay | Admitting: Internal Medicine

## 2013-11-05 LAB — HM DIABETES EYE EXAM

## 2013-11-05 LAB — BASIC METABOLIC PANEL
BUN: 29 mg/dL — AB (ref 4–21)
Creatinine: 1.1 mg/dL (ref 0.5–1.1)
GLUCOSE: 108 mg/dL
SODIUM: 142 mmol/L (ref 137–147)

## 2013-11-10 LAB — HEMOGLOBIN A1C: HEMOGLOBIN A1C: 5.8 % (ref 4.0–6.0)

## 2013-11-13 ENCOUNTER — Other Ambulatory Visit: Payer: Self-pay | Admitting: Internal Medicine

## 2013-11-13 LAB — LIPID PANEL
CHOLESTEROL: 174 mg/dL (ref 0–200)
HDL: 53 mg/dL (ref 35–70)
LDL Cholesterol: 80 mg/dL
TRIGLYCERIDES: 276 mg/dL — AB (ref 40–160)

## 2013-11-13 LAB — HEMOGLOBIN A1C: Hgb A1c MFr Bld: 5.8 % (ref 4.0–6.0)

## 2013-11-28 ENCOUNTER — Telehealth: Payer: Self-pay | Admitting: *Deleted

## 2013-11-28 MED ORDER — AMITRIPTYLINE HCL 50 MG PO TABS: 50.0000 mg | ORAL_TABLET | Freq: Every day | ORAL | Status: DC

## 2013-11-28 NOTE — Telephone Encounter (Signed)
[place] to [place] controlled Rx over state lines. She will have to wait until she returns to get filled locally through another provider.

## 2013-11-28 NOTE — Telephone Encounter (Signed)
Patient on vacation in [place] and would like to have amitriptyline refilled locally please advise if OK  Last OV 10/14

## 2013-11-28 NOTE — Telephone Encounter (Signed)
Sent 30 day supply per Dr. Dan HumphreysWalker to pharmacy.

## 2013-12-09 ENCOUNTER — Other Ambulatory Visit: Payer: Self-pay | Admitting: Internal Medicine

## 2013-12-09 NOTE — Telephone Encounter (Signed)
Ok to fill last OV 10/14

## 2013-12-09 NOTE — Telephone Encounter (Signed)
Patient notified and [facility] Script faxed as requested.

## 2013-12-09 NOTE — Telephone Encounter (Signed)
Refill one 30 days only.  Has not been seen in over 6 months so needs office visit prior to any more refills RX [facility] NOT SURE IF SHE IS IN [place]  OR [place]

## 2013-12-25 ENCOUNTER — Other Ambulatory Visit: Payer: Self-pay | Admitting: Internal Medicine

## 2014-01-28 ENCOUNTER — Telehealth: Payer: Self-pay | Admitting: Internal Medicine

## 2014-01-28 MED ORDER — AMLODIPINE BESYLATE 10 MG PO TABS: ORAL_TABLET | ORAL | Status: DC

## 2014-01-28 MED ORDER — MELOXICAM 15 MG PO TABS: ORAL_TABLET | ORAL | Status: DC

## 2014-01-28 MED ORDER — AMITRIPTYLINE HCL 50 MG PO TABS: ORAL_TABLET | ORAL | Status: DC

## 2014-01-28 NOTE — Telephone Encounter (Signed)
The patient is completely out of her medication . She stated she asked the mail order company to send a fax for he medication refills ,but we did not receive them. She is [facility] [address] and the rest called to her mail order pharmacy .   She is needing :  amitriptyline (ELAVIL) 50 MG tablet  amLODipine (NORVASC) 10 MG tablet  meloxicam (MOBIC) 15 MG tablet

## 2014-01-28 NOTE — Telephone Encounter (Signed)
Patient in town, No ov since 10/14 and no labs 9/14 ok to fill?

## 2014-01-28 NOTE — Telephone Encounter (Signed)
Refill one 30 days only.  Has not been seen in over 6 months so needs office visit prior to any more refills to vbe sent to mail order

## 2014-01-29 NOTE — Telephone Encounter (Signed)
Patient notified

## 2014-02-10 ENCOUNTER — Encounter: Payer: Self-pay | Admitting: Internal Medicine

## 2014-02-10 ENCOUNTER — Ambulatory Visit (INDEPENDENT_AMBULATORY_CARE_PROVIDER_SITE_OTHER): Payer: 59 | Admitting: Internal Medicine

## 2014-02-10 VITALS — BP 158/62 | HR 74 | Resp 16 | Ht 59.75 in | Wt 128.0 lb

## 2014-02-10 DIAGNOSIS — D649 Anemia, unspecified: Secondary | ICD-10-CM

## 2014-02-10 DIAGNOSIS — I10 Essential (primary) hypertension: Secondary | ICD-10-CM

## 2014-02-10 DIAGNOSIS — E119 Type 2 diabetes mellitus without complications: Secondary | ICD-10-CM

## 2014-02-10 DIAGNOSIS — I70209 Unspecified atherosclerosis of native arteries of extremities, unspecified extremity: Secondary | ICD-10-CM

## 2014-02-10 DIAGNOSIS — I2510 Atherosclerotic heart disease of native coronary artery without angina pectoris: Secondary | ICD-10-CM

## 2014-02-10 DIAGNOSIS — R011 Cardiac murmur, unspecified: Secondary | ICD-10-CM

## 2014-02-10 NOTE — Assessment & Plan Note (Signed)
On statin and asa,  Has an appt next week with [facility]

## 2014-02-10 NOTE — Progress Notes (Signed)
Patient ID: Leah Preston, female   DOB: 03-23-1931, 80 y.o.   MRN: [id number]  Patient Active Problem List   Diagnosis Date Noted  . Diabetes mellitus, new onset 06/14/2013  . Routine general medical examination at a health care facility 06/14/2013  . UTI (urinary tract infection) 06/11/2013  . Encounter for medication refill 04/07/2013  . Cough 01/10/2012  . Anemia 01/10/2012  . Peripheral vascular disease   . Screening for colon cancer 06/10/2011  . Atherosclerotic peripheral vascular disease 06/10/2011  . Hyperlipidemia   . Hypertension   . OA (osteoarthritis of spine)   . coronary artery disease   . subdurah/subarachnoid hemorrhage 02/17/2011    Subjective:  CC:   Chief Complaint  Patient presents with  . Medication Refill    HPI:   Leah Preston is a 80 y.o. female who presents for Follow up on  chronic conditions including hyperlipidemia,  Hypertension,  and CVD,  Speech still a bit slurred,  Has been out of town since  October in [place] . She had one ER visit for UTI  In February , which was treated with an antibiotic which was then changed to  macrobid for resistant organism.    Then one daily as suppressive therapy which she has been doing since then.  Had a fasting labs done while in [place],  LDL 80 trigs 276 (down from 340) on lipitor Cr was 1.1 Ac was 5.8    Past Medical History  Diagnosis Date  . Peripheral vascular disease     s/p carotid endarterectomy  . History of bladder problems   . Hypertension   . Hyperlipidemia   . OA (osteoarthritis of spine)     with chronic pain  . coronary artery disease   . subdurah/subarachnoid hemorrhage jun 2012    left parietal, sent to [facility]    Past Surgical History  Procedure Laterality Date  . Cesarean section    . Carotid endarterectomy  jan 2012    left, complicated by large hematoma, evacuated  . Fracture surgery  2006    Left radial pinning  . Joint replacement      Left TKR 2010, [facility]  . Bladder suspension   2003    [facility]  . Cataract extraction, bilateral  August 2013  . Abdominal hysterectomy  1968    menorrhagia       The following portions of the patient's history were reviewed and updated as appropriate: Allergies, current medications, and problem list.    Review of Systems:   Patient denies headache, fevers, malaise, unintentional weight loss, skin rash, eye pain, sinus congestion and sinus pain, sore throat, dysphagia,  hemoptysis , cough, dyspnea, wheezing, chest pain, palpitations, orthopnea, edema, abdominal pain, nausea, melena, diarrhea, constipation, flank pain, dysuria, hematuria, urinary  Frequency, nocturia, numbness, tingling, seizures,  Focal weakness, Loss of consciousness,  Tremor, insomnia, depression, anxiety, and suicidal ideation.     History   Social History  . Marital Status: Single    Spouse Name: N/A    Number of Children: N/A  . Years of Education: N/A   Occupational History  . Not on file.   Social History Main Topics  . Smoking status: Former Smoker    Quit date: 06/07/1995  . Smokeless tobacco: Never Used  . Alcohol Use: No  . Drug Use: No  . Sexual Activity: Not on file   Other Topics Concern  . Not on file   Social History Narrative   Lives alone after divorce in  73. Daughter in unemployed and living with her since August 2008 (and granddaughter, 44).      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 ROM normal. No CVA tenderness. Lungs: clear to  auscultation bilaterally Heart: regular rate and rhythm, S1, S2 normal, no murmur, click, rub or gallop Abdomen: soft, non-tender; bowel sounds normal; no masses,  no organomegaly Pulses: 2+ and symmetric Skin: Skin color, texture, turgor normal. No rashes or lesions Lymph nodes: Cervical, supraclavicular, and axillary nodes normal.  Assessment and Plan:  Atherosclerotic peripheral vascular disease On statin and asa,  Has an appt next week with [facility]   Hypertension Well controlled on current regimen. Renal function stable, no changes today.  Lab Results  Component Value Date   CREATININE 1.1 02/10/2014     Diabetes mellitus, new onset Well-controlled on diet alone .  hemoglobin A1c has been consistently less than 7.0 . Patient is up-to-date on eye exams and foot exam was done today.  There is  no proteinuria on today's micro urinalysis .  Fasting lipids have been reviewed and statin therapy has been advised and updated according to new ACC guidelines based on patient's 10 year risk of CAD   Lab Results  Component Value Date   HGBA1C 5.8 11/13/2013     Anemia Resolved by recent [facility]  hgb 11.6   A total of 40 minutes was spent with patient more than half of which was spent in counseling, reviewing records from other outside providers and coordination of care.  Updated Medication List Outpatient Encounter Prescriptions as of 02/10/2014  Medication Sig  . amitriptyline (ELAVIL) 50 MG tablet TAKE 1 TABLET (50 MG TOTAL) BY MOUTH DAILY.  Marland Kitchen amLODipine (NORVASC) 10 MG tablet TAKE 1 TABLET DAILY  . Ascorbic Acid (VITAMIN C PO) Take by mouth daily.    . clorazepate (TRANXENE) 7.5 MG tablet Take 1 tablet (7.5 mg total) by mouth daily.  Marland Kitchen glucose blood test strip Use as instructed to check blood sugars once daily  250.00  . levETIRAcetam (KEPPRA) 1000 MG tablet Take 1 tablet (1,000 mg total) by mouth 2 (two) times daily.  Marland Kitchen levothyroxine (SYNTHROID, LEVOTHROID) 50 MCG tablet TAKE 1 TABLET  DAILY  . lisinopril (PRINIVIL,ZESTRIL) 20 MG tablet TAKE 1 TABLET DAILY  . meloxicam (MOBIC) 15 MG tablet TAKE 1 TABLET DAILY  . metoprolol (LOPRESSOR) 50 MG tablet TAKE 1 TABLET TWICE A DAY  . Multiple Vitamins-Minerals (CENTRUM SILVER PO) Take by mouth daily.    . nitrofurantoin (MACRODANTIN) 50 MG capsule Take 50 mg by mouth daily.  . [DISCONTINUED] Choline Fenofibrate (FENOFIBRIC ACID) 135 MG CPDR Take 1 capsule by mouth at bedtime.  Marland Kitchen atorvastatin (LIPITOR) 40 MG tablet TAKE 1 TABLET DAILY     Orders Placed This Encounter  Procedures  . Lipid panel  . Hemoglobin A1c  . Microalbumin / creatinine urine ratio  . Comprehensive metabolic panel  . Hemoglobin A1c  . Basic metabolic panel  . Ambulatory referral to Cardiology    No Follow-up  on file.

## 2014-02-10 NOTE — Progress Notes (Signed)
Pre visit review using our clinic review tool, if applicable. No additional management support is needed unless otherwise documented below in the visit note. 

## 2014-02-10 NOTE — Patient Instructions (Addendum)
I want you to see Dr Gwen Pounds to check on your heart and evaluate the murmur I hear   You do not need to continue the bladder infection medication daily  Return on or after Sept 27th for your annual exam

## 2014-02-11 ENCOUNTER — Other Ambulatory Visit: Payer: Self-pay | Admitting: Internal Medicine

## 2014-02-11 LAB — COMPREHENSIVE METABOLIC PANEL
ALK PHOS: 37 U/L — AB (ref 39–117)
ALT: 27 U/L (ref 0–35)
AST: 26 U/L (ref 0–37)
Albumin: 4.2 g/dL (ref 3.5–5.2)
BUN: 27 mg/dL — AB (ref 6–23)
CHLORIDE: 100 meq/L (ref 96–112)
CO2: 30 meq/L (ref 19–32)
Calcium: 10.4 mg/dL (ref 8.4–10.5)
Creatinine, Ser: 1.1 mg/dL (ref 0.4–1.2)
GFR: 49.14 mL/min — ABNORMAL LOW (ref >60.00)
Glucose, Bld: 63 mg/dL — ABNORMAL LOW (ref 70–99)
POTASSIUM: 4.6 meq/L (ref 3.5–5.1)
SODIUM: 139 meq/L (ref 135–145)
TOTAL PROTEIN: 7.6 g/dL (ref 6.0–8.3)
Total Bilirubin: 0.5 mg/dL (ref 0.2–1.2)

## 2014-02-11 LAB — MICROALBUMIN / CREATININE URINE RATIO
Creatinine,U: 59.8 mg/dL
Microalb Creat Ratio: 7.5 mg/g (ref 0.0–30.0)
Microalb, Ur: 4.5 mg/dL — ABNORMAL HIGH (ref 0.0–1.9)

## 2014-02-11 LAB — HM DIABETES FOOT EXAM: HM Diabetic Foot Exam: NORMAL

## 2014-02-11 MED ORDER — MELOXICAM 15 MG PO TABS: ORAL_TABLET | ORAL | Status: DC

## 2014-02-11 MED ORDER — AMITRIPTYLINE HCL 50 MG PO TABS: ORAL_TABLET | ORAL | Status: DC

## 2014-02-11 MED ORDER — AMLODIPINE BESYLATE 10 MG PO TABS: ORAL_TABLET | ORAL | Status: DC

## 2014-02-11 NOTE — Assessment & Plan Note (Signed)
Resolved by recent C [facility]  hgb 11.6

## 2014-02-11 NOTE — Addendum Note (Signed)
Addended by: Sherlene Shams on: 02/11/2014 02:57 PM   Modules accepted: Orders

## 2014-02-11 NOTE — Assessment & Plan Note (Addendum)
Well-controlled on diet alone .  hemoglobin A1c has been consistently less than 7.0 . Patient is up-to-date on eye exams and foot exam was done today.  There is  no proteinuria on today's micro urinalysis .  Fasting lipids have been reviewed and statin therapy has been advised and updated according to new ACC guidelines based on patient's 10 year risk of CAD   Lab Results  Component Value Date   HGBA1C 5.8 11/13/2013

## 2014-02-11 NOTE — Assessment & Plan Note (Signed)
Well controlled on current regimen. Renal function stable, no changes today.  Lab Results  Component Value Date   CREATININE 1.1 02/10/2014

## 2014-03-04 ENCOUNTER — Other Ambulatory Visit: Payer: Self-pay | Admitting: Internal Medicine

## 2014-03-07 ENCOUNTER — Other Ambulatory Visit: Payer: Self-pay | Admitting: Internal Medicine

## 2014-03-09 ENCOUNTER — Telehealth: Payer: Self-pay | Admitting: Internal Medicine

## 2014-03-09 ENCOUNTER — Other Ambulatory Visit: Payer: Self-pay | Admitting: Internal Medicine

## 2014-03-09 ENCOUNTER — Other Ambulatory Visit: Payer: Self-pay | Admitting: *Deleted

## 2014-03-09 MED ORDER — AMITRIPTYLINE HCL 50 MG PO TABS: ORAL_TABLET | ORAL | Status: DC

## 2014-03-09 NOTE — Telephone Encounter (Signed)
amitriptyline (ELAVIL) 50 MG tablet [facility]

## 2014-03-09 NOTE — Telephone Encounter (Signed)
Spoke with pt, she is going to call [facility] to check on status of 01/2014 Rx of #90.

## 2014-03-12 ENCOUNTER — Other Ambulatory Visit: Payer: Self-pay | Admitting: Internal Medicine

## 2014-03-13 NOTE — Telephone Encounter (Signed)
Last OV 5/15 ok to fill?

## 2014-03-15 ENCOUNTER — Encounter: Payer: Self-pay | Admitting: Internal Medicine

## 2014-03-15 DIAGNOSIS — I701 Atherosclerosis of renal artery: Secondary | ICD-10-CM | POA: Insufficient documentation

## 2014-03-31 ENCOUNTER — Ambulatory Visit (INDEPENDENT_AMBULATORY_CARE_PROVIDER_SITE_OTHER): Payer: Medicare Other | Admitting: Internal Medicine

## 2014-03-31 ENCOUNTER — Encounter: Payer: Self-pay | Admitting: Internal Medicine

## 2014-03-31 VITALS — BP 148/60 | HR 81 | Temp 97.7°F | Resp 20 | Ht 59.75 in | Wt 125.8 lb

## 2014-03-31 DIAGNOSIS — Z23 Encounter for immunization: Secondary | ICD-10-CM

## 2014-03-31 DIAGNOSIS — E119 Type 2 diabetes mellitus without complications: Secondary | ICD-10-CM

## 2014-03-31 DIAGNOSIS — E785 Hyperlipidemia, unspecified: Secondary | ICD-10-CM

## 2014-03-31 DIAGNOSIS — I739 Peripheral vascular disease, unspecified: Secondary | ICD-10-CM

## 2014-03-31 MED ORDER — TETANUS-DIPHTH-ACELL PERTUSSIS 5-2.5-18.5 LF-MCG/0.5 IM SUSP: 0.5000 mL | Freq: Once | INTRAMUSCULAR | Status: DC

## 2014-03-31 MED ORDER — ZOSTER VACCINE LIVE 19400 UNT/0.65ML ~~LOC~~ SOLR
0.6500 mL | Freq: Once | SUBCUTANEOUS | Status: DC
Start: 2014-03-31 — End: 2014-05-23

## 2014-03-31 NOTE — Assessment & Plan Note (Addendum)
Managed unit now with fenofibrate ,  But given her history of PAF she needs statin therapy.  Long discussion about benefits and risks of statin therapy,

## 2014-03-31 NOTE — Assessment & Plan Note (Addendum)
She is due for  repeat a1c and fasting lipids.  She is on an ACE Inhibitor and foot exam is normal.  She is overdue for eye exam.  Agree with change to atorvastatin . Diet reviewed Lab Results  Component Value Date   HGBA1C 5.8 11/13/2013

## 2014-03-31 NOTE — Assessment & Plan Note (Signed)
With history of renal artery stenosis and bilateral carotid stenosis s/p bialteral CEAs.  repaet eval  Was in May with some progression  or renal stenosis ,  fdue again October

## 2014-03-31 NOTE — Patient Instructions (Addendum)
I agree with stopping your fenofibric (trilipix) and starting Lipirot (atorvastatin).  You can finish your current bottle of fenofibric before you start  the atorvastatin ,  do not refill the fenofibric.  The atorvastatin prevents strokes and progression of your carotid and renal artery stenosis better than thr fenofibric  You should not skip breakfast.  Drink one of these protein shakes instead:  Atkins  EAS Carb control  Glucerna  Return for fasting labs this week  Your need to have  the Shingles vaccine but you can get them  for less $$$ at a local pharmacy with the script I have provided.   You received the pneumonia vaccine today

## 2014-03-31 NOTE — Progress Notes (Signed)
Pre-visit discussion using our clinic review tool. No additional management support is needed unless otherwise documented below in the visit note.  

## 2014-04-01 ENCOUNTER — Encounter: Payer: Self-pay | Admitting: Internal Medicine

## 2014-04-01 NOTE — Progress Notes (Signed)
Patient ID: Leah Preston, female   DOB: Jan 13, 1931, 80 y.o.   MRN: [id number]   Patient Active Problem List   Diagnosis Date Noted  . Renal artery stenosis 03/15/2014  . Diabetes mellitus, new onset 06/14/2013  . Routine general medical examination at a health care facility 06/14/2013  . Peripheral vascular disease   . Screening for colon cancer 06/10/2011  . Atherosclerotic peripheral vascular disease 06/10/2011  . Hyperlipidemia   . Hypertension   . OA (osteoarthritis of spine)   . coronary artery disease   . subdurah/subarachnoid hemorrhage 02/17/2011    Subjective:  CC:   Chief Complaint  Patient presents with  . Follow-up    dr. Gwen Pounds,  stress test.    HPI:   Leah Preston is a 80 y.o. female who presents for  Follow up on chronic conditions including new onset diabetes mellitus,  hypertension with history of SAH,, hyperlipidemia, PAD,  CAD.    1) CAD: She recently had a stress test which was negative , but her cardiologist recommended change in therapy from trilipix to atorvastatin but she has not made the change.   2) DM: she has not been checking her blood sugars or following a low glycemic index diet.  3) Hypertension: she is taking her medications with no adverse effects. She does not check her blood pressure     Past Medical History  Diagnosis Date  . Peripheral vascular disease     s/p carotid endarterectomy  . History of bladder problems   . Hypertension   . Hyperlipidemia   . OA (osteoarthritis of spine)     with chronic pain  . coronary artery disease   . subdurah/subarachnoid hemorrhage jun 2012    left parietal, sent to [facility]    Past Surgical History  Procedure Laterality Date  . Cesarean section    . Carotid endarterectomy  jan 2012    left, complicated by large hematoma, evacuated  . Fracture surgery  2006    Left radial pinning  . Joint replacement      Left TKR 2010, [facility]  . Bladder suspension  2003    [facility]  . Cataract  extraction, bilateral  August 2013  . Abdominal hysterectomy  1968    menorrhagia       The following portions of the patient's history were reviewed and updated as appropriate: Allergies, current medications, and problem list.    Review of Systems:   Patient denies headache, fevers, malaise, unintentional weight loss, skin rash, eye pain, sinus congestion and sinus pain, sore throat, dysphagia,  hemoptysis , cough, dyspnea, wheezing, chest pain, palpitations, orthopnea, edema, abdominal pain, nausea, melena, diarrhea, constipation, flank pain, dysuria, hematuria, urinary  Frequency, nocturia, numbness, tingling, seizures,  Focal weakness, Loss of consciousness,  Tremor, insomnia, depression, anxiety, and suicidal ideation.     History   Social History  . Marital Status: Single    Spouse Name: N/A    Number of Children: N/A  . Years of Education: N/A   Occupational History  . Not on file.   Social History Main Topics  . Smoking status: Former Smoker    Quit date: 06/07/1995  . Smokeless tobacco: Never Used  . Alcohol Use: No  . Drug Use: No  . Sexual Activity: Not on file   Other Topics Concern  . Not on file   Social History Narrative   Lives alone after divorce in 20. Daughter in unemployed and living with her since August 2008 (and  granddaughter, 20).      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 ROM normal. No CVA tenderness. Lungs: clear to  auscultation bilaterally Heart: regular rate and rhythm, S1, S2 normal, no murmur, click, rub or gallop Abdomen: soft, non-tender; bowel sounds normal; no masses,  no organomegaly Pulses: 2+ and symmetric Skin: Skin color, texture, turgor normal. No rashes or lesions Lymph nodes: Cervical, supraclavicular, and axillary nodes normal. Foot exam:  Nails are well trimmed,  No callouses,  Sensation intact to microfilament   Assessment and Plan:  Hyperlipidemia Managed unit now with fenofibrate ,  But given her history of PAF she needs statin therapy.  Long discussion about benefits and risks of statin therapy,    Diabetes mellitus, new onset She is due for  repeat a1c and fasting lipids.  She is on an ACE Inhibitor and foot exam is normal.  She is overdue for eye exam.  Agree with change to atorvastatin . Diet reviewed Lab Results  Component Value Date   HGBA1C 5.8 11/13/2013     Peripheral vascular disease With history of renal artery stenosis and bilateral carotid stenosis s/p bialteral CEAs.  repaet eval  Was in May with some progression  or renal stenosis ,  fdue again October    Updated Medication List Outpatient Encounter Prescriptions as of 03/31/2014  Medication Sig  . amitriptyline (ELAVIL) 50 MG tablet TAKE 1 TABLET (50 MG TOTAL) BY MOUTH DAILY.  Marland Kitchen amitriptyline (ELAVIL) 50 MG tablet TAKE 1 TABLET (50 MG TOTAL) BY MOUTH DAILY.  Marland Kitchen amLODipine (NORVASC) 10 MG tablet TAKE 1 TABLET DAILY  . Ascorbic Acid (VITAMIN C PO) Take by mouth daily.    Marland Kitchen atorvastatin (LIPITOR) 40 MG tablet TAKE 1 TABLET DAILY  . clorazepate (TRANXENE) 7.5 MG tablet Take 1 tablet (7.5 mg total) by mouth daily.  Marland Kitchen esomeprazole (NEXIUM) 40 MG capsule TAKE 1 CAPSULE DAILY  . glucose blood test strip Use as instructed to check blood sugars once daily  250.00  . levETIRAcetam (KEPPRA) 1000 MG tablet Take 1 tablet (1,000 mg total) by mouth 2 (two) times daily.  Marland Kitchen levothyroxine (SYNTHROID, LEVOTHROID) 50 MCG  tablet TAKE 1 TABLET DAILY  . lisinopril (PRINIVIL,ZESTRIL) 20 MG tablet TAKE 1 TABLET DAILY  . meloxicam (MOBIC) 15 MG tablet TAKE 1 TABLET BY MOUTH EVERY DAY  . metoprolol (LOPRESSOR) 50 MG tablet TAKE 1 TABLET TWICE A DAY  . Multiple Vitamins-Minerals (CENTRUM SILVER PO) Take by mouth daily.    . nitrofurantoin (MACRODANTIN) 50 MG capsule Take 50 mg by mouth daily.  . Tdap (BOOSTRIX) 5-2.5-18.5 LF-MCG/0.5 injection Inject 0.5 mLs into the muscle once.  . zoster vaccine live, PF, (ZOSTAVAX) 16109 UNT/0.65ML injection Inject 19,400 Units into the skin once.  . [DISCONTINUED] Choline Fenofibrate (FENOFIBRIC ACID) 135 MG CPDR TAKE 1 CAPSULE AT BEDTIME  . [DISCONTINUED] levETIRAcetam (KEPPRA) 1000 MG tablet TAKE 1 TABLET TWICE A DAY     Orders Placed This Encounter  Procedures  . Pneumococcal  conjugate vaccine 13-valent  . Comprehensive metabolic panel  . Hemoglobin A1c  . Lipid panel  . Microalbumin / creatinine urine ratio    Return for follow up diabetes.

## 2014-04-02 ENCOUNTER — Other Ambulatory Visit (INDEPENDENT_AMBULATORY_CARE_PROVIDER_SITE_OTHER): Payer: Medicare Other

## 2014-04-02 DIAGNOSIS — E785 Hyperlipidemia, unspecified: Secondary | ICD-10-CM

## 2014-04-02 DIAGNOSIS — E119 Type 2 diabetes mellitus without complications: Secondary | ICD-10-CM

## 2014-04-02 LAB — LIPID PANEL
CHOLESTEROL: 187 mg/dL (ref 0–200)
HDL: 50 mg/dL (ref >39.00)
LDL Cholesterol: 88 mg/dL (ref 0–99)
NonHDL: 137
TRIGLYCERIDES: 245 mg/dL — AB (ref 0.0–149.0)
Total CHOL/HDL Ratio: 4
VLDL: 49 mg/dL — AB (ref 0.0–40.0)

## 2014-04-02 LAB — COMPREHENSIVE METABOLIC PANEL
ALK PHOS: 38 U/L — AB (ref 39–117)
ALT: 19 U/L (ref 0–35)
AST: 21 U/L (ref 0–37)
Albumin: 4 g/dL (ref 3.5–5.2)
BILIRUBIN TOTAL: 0.4 mg/dL (ref 0.2–1.2)
BUN: 32 mg/dL — ABNORMAL HIGH (ref 6–23)
CO2: 30 mEq/L (ref 19–32)
Calcium: 10.1 mg/dL (ref 8.4–10.5)
Chloride: 104 mEq/L (ref 96–112)
Creatinine, Ser: 1.3 mg/dL — ABNORMAL HIGH (ref 0.4–1.2)
GFR: 43.72 mL/min — ABNORMAL LOW (ref >60.00)
GLUCOSE: 114 mg/dL — AB (ref 70–99)
Potassium: 4.7 mEq/L (ref 3.5–5.1)
Sodium: 139 mEq/L (ref 135–145)
Total Protein: 7.3 g/dL (ref 6.0–8.3)

## 2014-04-02 LAB — MICROALBUMIN / CREATININE URINE RATIO
Creatinine,U: 49 mg/dL
MICROALB/CREAT RATIO: 15.7 mg/g (ref 0.0–30.0)
Microalb, Ur: 7.7 mg/dL — ABNORMAL HIGH (ref 0.0–1.9)

## 2014-04-02 LAB — HEMOGLOBIN A1C: HEMOGLOBIN A1C: 5.7 % (ref 4.6–6.5)

## 2014-04-03 DIAGNOSIS — N179 Acute kidney failure, unspecified: Secondary | ICD-10-CM | POA: Insufficient documentation

## 2014-04-21 ENCOUNTER — Telehealth: Payer: Self-pay | Admitting: Internal Medicine

## 2014-04-21 DIAGNOSIS — N183 Chronic kidney disease, stage 3 unspecified: Secondary | ICD-10-CM

## 2014-04-21 NOTE — Telephone Encounter (Signed)
Patient called to follow up on labs from 04/03/14 patient has been in [place] since that date patient needs non fasting labs to check kidney function since stopping the meloxicam . CMET and Micro albumin/creatinine is that right?

## 2014-04-22 NOTE — Telephone Encounter (Signed)
Yes,  Labs ordered  Thanks!

## 2014-04-22 NOTE — Telephone Encounter (Signed)
Patient stated she will schedule lab appointment.

## 2014-05-10 ENCOUNTER — Other Ambulatory Visit: Payer: Self-pay | Admitting: Internal Medicine

## 2014-05-14 ENCOUNTER — Other Ambulatory Visit: Payer: Self-pay | Admitting: Internal Medicine

## 2014-05-20 ENCOUNTER — Encounter: Payer: Self-pay | Admitting: Internal Medicine

## 2014-05-20 ENCOUNTER — Ambulatory Visit (INDEPENDENT_AMBULATORY_CARE_PROVIDER_SITE_OTHER): Payer: Medicare Other | Admitting: Internal Medicine

## 2014-05-20 VITALS — BP 144/60 | HR 70 | Temp 97.8°F | Resp 16 | Ht 59.75 in | Wt 127.2 lb

## 2014-05-20 DIAGNOSIS — H65191 Other acute nonsuppurative otitis media, right ear: Secondary | ICD-10-CM

## 2014-05-20 DIAGNOSIS — M47814 Spondylosis without myelopathy or radiculopathy, thoracic region: Secondary | ICD-10-CM

## 2014-05-20 DIAGNOSIS — N183 Chronic kidney disease, stage 3 unspecified: Secondary | ICD-10-CM

## 2014-05-20 DIAGNOSIS — I70209 Unspecified atherosclerosis of native arteries of extremities, unspecified extremity: Secondary | ICD-10-CM

## 2014-05-20 DIAGNOSIS — M899 Disorder of bone, unspecified: Secondary | ICD-10-CM

## 2014-05-20 DIAGNOSIS — H66009 Acute suppurative otitis media without spontaneous rupture of ear drum, unspecified ear: Secondary | ICD-10-CM

## 2014-05-20 DIAGNOSIS — H65199 Other acute nonsuppurative otitis media, unspecified ear: Secondary | ICD-10-CM

## 2014-05-20 DIAGNOSIS — I10 Essential (primary) hypertension: Secondary | ICD-10-CM

## 2014-05-20 DIAGNOSIS — I739 Peripheral vascular disease, unspecified: Secondary | ICD-10-CM

## 2014-05-20 DIAGNOSIS — M8580 Other specified disorders of bone density and structure, unspecified site: Secondary | ICD-10-CM

## 2014-05-20 DIAGNOSIS — M949 Disorder of cartilage, unspecified: Secondary | ICD-10-CM

## 2014-05-20 DIAGNOSIS — M47815 Spondylosis without myelopathy or radiculopathy, thoracolumbar region: Secondary | ICD-10-CM

## 2014-05-20 DIAGNOSIS — H66002 Acute suppurative otitis media without spontaneous rupture of ear drum, left ear: Secondary | ICD-10-CM

## 2014-05-20 DIAGNOSIS — I701 Atherosclerosis of renal artery: Secondary | ICD-10-CM

## 2014-05-20 LAB — COMPREHENSIVE METABOLIC PANEL
ALBUMIN: 4.3 g/dL (ref 3.5–5.2)
ALT: 25 U/L (ref 0–35)
AST: 15 U/L (ref 0–37)
Alkaline Phosphatase: 39 U/L (ref 39–117)
BUN: 24 mg/dL — ABNORMAL HIGH (ref 6–23)
CALCIUM: 10 mg/dL (ref 8.4–10.5)
CHLORIDE: 100 meq/L (ref 96–112)
CO2: 30 meq/L (ref 19–32)
CREATININE: 1.1 mg/dL (ref 0.4–1.2)
GFR: 52.87 mL/min — ABNORMAL LOW (ref >60.00)
Glucose, Bld: 78 mg/dL (ref 70–99)
Potassium: 4.5 mEq/L (ref 3.5–5.1)
Sodium: 138 mEq/L (ref 135–145)
Total Bilirubin: 0.7 mg/dL (ref 0.2–1.2)
Total Protein: 7.5 g/dL (ref 6.0–8.3)

## 2014-05-20 LAB — CBC WITH DIFFERENTIAL/PLATELET
BASOS PCT: 0.9 % (ref 0.0–3.0)
Basophils Absolute: 0.1 10*3/uL (ref 0.0–0.1)
EOS ABS: 0.4 10*3/uL (ref 0.0–0.7)
Eosinophils Relative: 4.9 % (ref 0.0–5.0)
HCT: 33.5 % — ABNORMAL LOW (ref 36.0–46.0)
Hemoglobin: 11.4 g/dL — ABNORMAL LOW (ref 12.0–15.0)
LYMPHS PCT: 37.3 % (ref 12.0–46.0)
Lymphs Abs: 3 10*3/uL (ref 0.7–4.0)
MCHC: 34 g/dL (ref 30.0–36.0)
MCV: 94.7 fl (ref 78.0–100.0)
Monocytes Absolute: 0.7 10*3/uL (ref 0.1–1.0)
Monocytes Relative: 8.5 % (ref 3.0–12.0)
NEUTROS PCT: 48.4 % (ref 43.0–77.0)
Neutro Abs: 3.8 10*3/uL (ref 1.4–7.7)
Platelets: 212 10*3/uL (ref 150.0–400.0)
RBC: 3.54 Mil/uL — AB (ref 3.87–5.11)
RDW: 13.4 % (ref 11.5–15.5)
WBC: 7.9 10*3/uL (ref 4.0–10.5)

## 2014-05-20 LAB — MICROALBUMIN / CREATININE URINE RATIO
Creatinine,U: 64.3 mg/dL
MICROALB/CREAT RATIO: 5 mg/g (ref 0.0–30.0)
Microalb, Ur: 3.2 mg/dL — ABNORMAL HIGH (ref 0.0–1.9)

## 2014-05-20 MED ORDER — AMOXICILLIN-POT CLAVULANATE 875-125 MG PO TABS: 1.0000 | ORAL_TABLET | Freq: Two times a day (BID) | ORAL | Status: DC

## 2014-05-20 MED ORDER — PREDNISONE (PAK) 10 MG PO TABS: ORAL_TABLET | ORAL | Status: DC

## 2014-05-20 NOTE — Progress Notes (Signed)
Pre-visit discussion using our clinic review tool. No additional management support is needed unless otherwise documented below in the visit note.  

## 2014-05-20 NOTE — Patient Instructions (Signed)
You have an ear infection   .  I am prescribing an antibiotic augmentin  and a 6 day prednisone taper  To manage the infection and the inflammation in your ear/sinuses.   I also advise use of the following OTC meds to help with your other symptoms.   Take generic OTC benadryl 25 mg every 8 hours for the drainage,  Sudafed PE  10 to 30 mg every 8 hours for the congestion, you may substitute Afrin nasal spray for the nighttime dose of sudafed PE  If needed to prevent insomnia.  Please take a probiotic ( Align, Floraque or Culturelle) for 2 weeks if you start the antibiotic to prevent a serious antibiotic associated diarrhea  Called" clostridium dificile colitis" ( should also help prevent   vaginal yeast infection)

## 2014-05-20 NOTE — Progress Notes (Signed)
Patient ID: Leah Preston, female   DOB: 1931/04/10, 80 y.o.   MRN: [id number]  Patient Active Problem List   Diagnosis Date Noted  . Otitis media 05/23/2014  . Acute kidney failure 04/03/2014  . Renal artery stenosis 03/15/2014  . Diabetes mellitus, new onset 06/14/2013  . Routine general medical examination at a health care facility 06/14/2013  . Peripheral vascular disease   . Screening for colon cancer 06/10/2011  . Atherosclerotic peripheral vascular disease 06/10/2011  . Hyperlipidemia   . Accelerated hypertension   . OA (osteoarthritis of spine)   . coronary artery disease   . subdurah/subarachnoid hemorrhage 02/17/2011    Subjective:  CC:   Chief Complaint  Patient presents with  . Follow-up    and to get labs to check Kidney function    HPI:   Leah Preston is a 80 y.o. female who presents for Follow up on PAD, hypertension,  CKD.  She was last seen in July at which time a deterioration in GFR was noted and meloxicam   Was stopped.  Since stopping the medication she has been reporting a lot of left shoulder pain and wrist pain.  She has a history of wrist fracture remotely secondary to fall at age 9 .    Has been suffering from sinus congestion , occipital headache, rhinitis with clear drainage,  no fevers, Symptoms have been present for about 4 or 5 days.  Last antibiotic use was over 2 months ago in [facility] for UTI .     Past Medical History  Diagnosis Date  . Peripheral vascular disease     s/p carotid endarterectomy  . History of bladder problems   . Hypertension   . Hyperlipidemia   . OA (osteoarthritis of spine)     with chronic pain  . coronary artery disease   . subdurah/subarachnoid hemorrhage jun 2012    left parietal, sent to [facility]    Past Surgical History  Procedure Laterality Date  . Cesarean section    . Carotid endarterectomy  jan 2012    left, complicated by large hematoma, evacuated  . Fracture surgery  2006    Left radial pinning  .  Joint replacement      Left TKR 2010, [facility]  . Bladder suspension  2003    [facility]  . Cataract extraction, bilateral  August 2013  . Abdominal hysterectomy  1968    menorrhagia       The following portions of the patient's history were reviewed and updated as appropriate: Allergies, current medications, and problem list.    Review of Systems:   Patient denies headache, fevers, malaise, unintentional weight loss, skin rash, eye pain, sinus congestion and sinus pain, sore throat, dysphagia,  hemoptysis , cough, dyspnea, wheezing, chest pain, palpitations, orthopnea, edema, abdominal pain, nausea, melena, diarrhea, constipation, flank pain, dysuria, hematuria, urinary  Frequency, nocturia, numbness, tingling, seizures,  Focal weakness, Loss of consciousness,  Tremor, insomnia, depression, anxiety, and suicidal ideation.     History   Social History  . Marital Status: Single    Spouse Name: N/A    Number of Children: N/A  . Years of Education: N/A   Occupational History  . Not on file.   Social History Main Topics  . Smoking status: Former Smoker    Quit date: 06/07/1995  . Smokeless tobacco: Never Used  . Alcohol Use: No  . Drug Use: No  . Sexual Activity: Not on file   Other Topics Concern  .  Not on file   Social History Narrative   Lives alone after divorce in 80. Daughter in unemployed and living with her since August 2008 (and granddaughter, 16).      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 ROM normal. No CVA tenderness. Lungs: clear to auscultation bilaterally Heart: regular rate and rhythm, S1, S2 normal, no murmur, click, rub or gallop Abdomen: soft, non-tender; bowel sounds normal; no masses,  no organomegaly Pulses: 2+ and symmetric Skin: Skin color, texture, turgor normal. No rashes or lesions Lymph nodes: Cervical, supraclavicular, and axillary nodes normal.  Assessment and Plan:  Accelerated hypertension Well controlled on current regimen. Cr has returned to baseline,  No changes today  Lab Results  Component Value Date   CREATININE 1.1 05/20/2014    Renal artery stenosis Managed by Ricardo Jericho with annual surveillance.  Peripheral vascular disease With history of renal artery stenosis and bilateral carotid stenosis s/p bialteral CEAs.  repaet eval  Was in May with some progression  or renal stenosis ,  fdue again October     Atherosclerotic peripheral vascular disease With history of renal artery stenosis and bilateral carotid stenosis s/p bialteral CEAs.  repeat eval  Was in May with some progression  of renal stenosis ,  She is due for evaluation again October .  Now taking atorvastating 40 mg daily  OA (osteoarthritis of spine) Ok to resume meloxicam   Otitis media Secondary to prolonged sinus congestion .,.  abx and supportive care outlined,  Probiotic   Updated Medication List Outpatient Encounter Prescriptions as of 05/20/2014  Medication Sig  . amitriptyline (ELAVIL) 50 MG tablet TAKE 1 TABLET (50 MG TOTAL) BY MOUTH DAILY.  Marland Kitchen amLODipine (NORVASC) 10 MG tablet TAKE 1 TABLET DAILY  . Ascorbic Acid (VITAMIN C PO) Take by mouth daily.    Marland Kitchen atorvastatin (LIPITOR) 40 MG tablet TAKE 1 TABLET DAILY  . clorazepate (TRANXENE) 7.5 MG tablet Take 1 tablet (7.5 mg total) by mouth daily.  Marland Kitchen esomeprazole (NEXIUM) 40 MG capsule TAKE 1 CAPSULE DAILY  . glucose blood test strip Use as instructed to check blood  sugars once daily  250.00  . levETIRAcetam (KEPPRA) 1000 MG tablet Take 1 tablet (1,000 mg total) by mouth 2 (two) times daily.  Marland Kitchen levothyroxine (SYNTHROID, LEVOTHROID) 50 MCG tablet TAKE 1 TABLET DAILY  . lisinopril (PRINIVIL,ZESTRIL) 20 MG tablet TAKE 1 TABLET DAILY  . metoprolol (LOPRESSOR) 50 MG tablet TAKE 1 TABLET TWICE A DAY  . Multiple Vitamins-Minerals (CENTRUM SILVER PO) Take by mouth daily.    Marland Kitchen amoxicillin-clavulanate (AUGMENTIN) 875-125 MG per tablet Take 1 tablet by mouth 2 (two) times daily.  . [DISCONTINUED] amitriptyline (ELAVIL) 50 MG tablet TAKE 1 TABLET (50 MG TOTAL) BY MOUTH DAILY.  . [DISCONTINUED] meloxicam (MOBIC) 15 MG tablet TAKE 1 TABLET BY MOUTH EVERY DAY  . [DISCONTINUED] nitrofurantoin (MACRODANTIN) 50 MG capsule Take 50 mg by mouth daily.  . [DISCONTINUED] predniSONE (STERAPRED UNI-PAK) 10 MG tablet 6  tablets on Day 1 , then reduce by 1 tablet daily until gone  . [DISCONTINUED] Tdap (BOOSTRIX) 5-2.5-18.5 LF-MCG/0.5 injection Inject 0.5 mLs into the muscle once.  . [DISCONTINUED] zoster vaccine live, PF, (ZOSTAVAX) 16109 UNT/0.65ML injection Inject 19,400 Units into the skin once.     Orders Placed This Encounter  Procedures  . DG Bone Density  . CBC with Differential    No Follow-up on file.

## 2014-05-23 ENCOUNTER — Encounter: Payer: Self-pay | Admitting: Internal Medicine

## 2014-05-23 DIAGNOSIS — H6690 Otitis media, unspecified, unspecified ear: Secondary | ICD-10-CM | POA: Insufficient documentation

## 2014-05-23 NOTE — Assessment & Plan Note (Signed)
Well controlled on current regimen. Cr has returned to baseline,  No changes today  Lab Results  Component Value Date   CREATININE 1.1 05/20/2014

## 2014-05-23 NOTE — Assessment & Plan Note (Signed)
Secondary to prolonged sinus congestion .,.  abx and supportive care outlined,  Probiotic

## 2014-05-23 NOTE — Assessment & Plan Note (Signed)
With history of renal artery stenosis and bilateral carotid stenosis s/p bialteral CEAs.  repaet eval  Was in May with some progression  or renal stenosis ,  fdue again October

## 2014-05-23 NOTE — Assessment & Plan Note (Signed)
Managed by Ricardo Jericho with annual surveillance.

## 2014-05-23 NOTE — Assessment & Plan Note (Signed)
Ok to resume meloxicam

## 2014-05-23 NOTE — Assessment & Plan Note (Signed)
With history of renal artery stenosis and bilateral carotid stenosis s/p bialteral CEAs.  repeat eval  Was in May with some progression  of renal stenosis ,  She is due for evaluation again October .  Now taking atorvastating 40 mg daily

## 2014-06-11 ENCOUNTER — Other Ambulatory Visit: Payer: Self-pay | Admitting: *Deleted

## 2014-06-11 ENCOUNTER — Telehealth: Payer: Self-pay | Admitting: *Deleted

## 2014-06-11 MED ORDER — CLORAZEPATE DIPOTASSIUM 7.5 MG PO TABS: 7.5000 mg | ORAL_TABLET | Freq: Every day | ORAL | Status: DC

## 2014-06-11 MED ORDER — AMLODIPINE BESYLATE 10 MG PO TABS: ORAL_TABLET | ORAL | Status: DC

## 2014-06-11 NOTE — Telephone Encounter (Signed)
Fax from [facility] requesting Clorazepate 7.5mg .  Last refill 7.24.15.  Please advise refill

## 2014-06-11 NOTE — Telephone Encounter (Signed)
Ok to refill,  printed rx  

## 2014-06-12 NOTE — Telephone Encounter (Signed)
Rx faxed by Olegario Messier, LPN

## 2014-06-13 ENCOUNTER — Other Ambulatory Visit: Payer: Self-pay | Admitting: *Deleted

## 2014-06-13 MED ORDER — AMLODIPINE BESYLATE 10 MG PO TABS: ORAL_TABLET | ORAL | Status: DC

## 2014-06-20 ENCOUNTER — Other Ambulatory Visit: Payer: Self-pay | Admitting: Internal Medicine

## 2014-06-22 ENCOUNTER — Other Ambulatory Visit: Payer: Self-pay | Admitting: *Deleted

## 2014-06-22 MED ORDER — CLORAZEPATE DIPOTASSIUM 7.5 MG PO TABS: 7.5000 mg | ORAL_TABLET | Freq: Every day | ORAL | Status: DC

## 2014-06-22 MED ORDER — AMLODIPINE BESYLATE 10 MG PO TABS: ORAL_TABLET | ORAL | Status: DC

## 2014-06-23 ENCOUNTER — Telehealth: Payer: Self-pay | Admitting: Internal Medicine

## 2014-06-23 NOTE — Telephone Encounter (Signed)
Fax from pharmacy requesting Clorazepate tabs 7.5mg .  Please advise refill

## 2014-06-24 MED ORDER — CLORAZEPATE DIPOTASSIUM 7.5 MG PO TABS: 7.5000 mg | ORAL_TABLET | Freq: Every day | ORAL | Status: DC

## 2014-06-24 NOTE — Telephone Encounter (Signed)
Ok to refill,  printed rx  

## 2014-06-24 NOTE — Telephone Encounter (Signed)
Script faxed to pharmacy as requested. 

## 2014-07-20 ENCOUNTER — Other Ambulatory Visit: Payer: Self-pay | Admitting: *Deleted

## 2014-07-20 MED ORDER — LEVOTHYROXINE SODIUM 50 MCG PO TABS: ORAL_TABLET | ORAL | Status: DC

## 2014-08-20 ENCOUNTER — Other Ambulatory Visit: Payer: Self-pay | Admitting: Internal Medicine

## 2014-10-07 ENCOUNTER — Other Ambulatory Visit: Payer: Self-pay | Admitting: Internal Medicine

## 2014-11-03 ENCOUNTER — Other Ambulatory Visit: Payer: Self-pay | Admitting: Internal Medicine

## 2014-11-06 ENCOUNTER — Other Ambulatory Visit: Payer: Self-pay | Admitting: Internal Medicine

## 2014-11-11 ENCOUNTER — Other Ambulatory Visit: Payer: Self-pay | Admitting: Internal Medicine

## 2015-01-13 ENCOUNTER — Other Ambulatory Visit: Payer: Self-pay | Admitting: Internal Medicine

## 2015-01-13 DIAGNOSIS — E034 Atrophy of thyroid (acquired): Secondary | ICD-10-CM

## 2015-01-13 DIAGNOSIS — E785 Hyperlipidemia, unspecified: Secondary | ICD-10-CM

## 2015-01-13 NOTE — Telephone Encounter (Signed)
Last visit 05/20/14. Last TSH 11/05/13. Spoke to pt, appt scheduled for labs 01/25/15. Appt scheduled with Dr. Darrick Huntsmanullo 01/27/15. Pt states she has 3-4 weeks of levothyroxine left and does not need refill until labs have been drawn. Notified mail order not to send a refill at this point until labs are back.  What other labs would you like besides TSH? Need to be fasting, advised I would call pt back and let her know if she needs to be fasting.  OK refill Meloxicam?

## 2015-01-14 ENCOUNTER — Other Ambulatory Visit: Payer: Self-pay | Admitting: Internal Medicine

## 2015-01-14 NOTE — Telephone Encounter (Signed)
Appt 01/27/15.

## 2015-01-14 NOTE — Telephone Encounter (Signed)
rx refilled,  Labs ordered thank you

## 2015-01-14 NOTE — Telephone Encounter (Signed)
Pt notified they are fasting labs. Needed to reschedule OV, rescheduled to 01/28/15

## 2015-01-16 ENCOUNTER — Other Ambulatory Visit: Payer: Self-pay | Admitting: Internal Medicine

## 2015-01-21 ENCOUNTER — Other Ambulatory Visit: Payer: Self-pay | Admitting: Internal Medicine

## 2015-01-25 ENCOUNTER — Other Ambulatory Visit (INDEPENDENT_AMBULATORY_CARE_PROVIDER_SITE_OTHER): Payer: Medicare Other

## 2015-01-25 DIAGNOSIS — E034 Atrophy of thyroid (acquired): Secondary | ICD-10-CM | POA: Diagnosis not present

## 2015-01-25 DIAGNOSIS — E038 Other specified hypothyroidism: Secondary | ICD-10-CM | POA: Diagnosis not present

## 2015-01-25 DIAGNOSIS — R7301 Impaired fasting glucose: Secondary | ICD-10-CM

## 2015-01-25 DIAGNOSIS — R0600 Dyspnea, unspecified: Secondary | ICD-10-CM

## 2015-01-25 DIAGNOSIS — E785 Hyperlipidemia, unspecified: Secondary | ICD-10-CM

## 2015-01-25 LAB — COMPREHENSIVE METABOLIC PANEL
ALT: 26 U/L (ref 0–35)
AST: 22 U/L (ref 0–37)
Albumin: 4 g/dL (ref 3.5–5.2)
Alkaline Phosphatase: 89 U/L (ref 39–117)
BUN: 23 mg/dL (ref 6–23)
CALCIUM: 9.9 mg/dL (ref 8.4–10.5)
CHLORIDE: 98 meq/L (ref 96–112)
CO2: 28 meq/L (ref 19–32)
CREATININE: 1.05 mg/dL (ref 0.40–1.20)
GFR: 53.36 mL/min — AB (ref >60.00)
Glucose, Bld: 117 mg/dL — ABNORMAL HIGH (ref 70–99)
POTASSIUM: 4.1 meq/L (ref 3.5–5.1)
SODIUM: 134 meq/L — AB (ref 135–145)
Total Bilirubin: 0.3 mg/dL (ref 0.2–1.2)
Total Protein: 7.3 g/dL (ref 6.0–8.3)

## 2015-01-25 LAB — LIPID PANEL
CHOL/HDL RATIO: 3
Cholesterol: 171 mg/dL (ref 0–200)
HDL: 64.6 mg/dL (ref >39.00)
LDL Cholesterol: 67 mg/dL (ref 0–99)
NonHDL: 106.4
Triglycerides: 199 mg/dL — ABNORMAL HIGH (ref 0.0–149.0)
VLDL: 39.8 mg/dL (ref 0.0–40.0)

## 2015-01-25 LAB — TSH: TSH: 1.1 u[IU]/mL (ref 0.35–4.50)

## 2015-01-27 ENCOUNTER — Ambulatory Visit: Payer: Self-pay | Admitting: Internal Medicine

## 2015-01-28 ENCOUNTER — Encounter: Payer: Self-pay | Admitting: Internal Medicine

## 2015-01-28 ENCOUNTER — Ambulatory Visit (INDEPENDENT_AMBULATORY_CARE_PROVIDER_SITE_OTHER): Payer: Medicare Other | Admitting: Internal Medicine

## 2015-01-28 VITALS — BP 122/58 | HR 84 | Temp 97.7°F | Resp 16 | Ht 60.0 in | Wt 131.0 lb

## 2015-01-28 DIAGNOSIS — R5383 Other fatigue: Secondary | ICD-10-CM

## 2015-01-28 DIAGNOSIS — R0609 Other forms of dyspnea: Secondary | ICD-10-CM | POA: Diagnosis not present

## 2015-01-28 DIAGNOSIS — G459 Transient cerebral ischemic attack, unspecified: Secondary | ICD-10-CM | POA: Diagnosis not present

## 2015-01-28 DIAGNOSIS — I70219 Atherosclerosis of native arteries of extremities with intermittent claudication, unspecified extremity: Secondary | ICD-10-CM

## 2015-01-28 DIAGNOSIS — G4700 Insomnia, unspecified: Secondary | ICD-10-CM

## 2015-01-28 DIAGNOSIS — Z8679 Personal history of other diseases of the circulatory system: Secondary | ICD-10-CM

## 2015-01-28 DIAGNOSIS — I739 Peripheral vascular disease, unspecified: Secondary | ICD-10-CM

## 2015-01-28 DIAGNOSIS — Z1159 Encounter for screening for other viral diseases: Secondary | ICD-10-CM

## 2015-01-28 DIAGNOSIS — R7301 Impaired fasting glucose: Secondary | ICD-10-CM

## 2015-01-28 DIAGNOSIS — I10 Essential (primary) hypertension: Secondary | ICD-10-CM

## 2015-01-28 DIAGNOSIS — Z87891 Personal history of nicotine dependence: Secondary | ICD-10-CM

## 2015-01-28 DIAGNOSIS — E785 Hyperlipidemia, unspecified: Secondary | ICD-10-CM

## 2015-01-28 MED ORDER — TEMAZEPAM 7.5 MG PO CAPS: 7.5000 mg | ORAL_CAPSULE | Freq: Every evening | ORAL | Status: DC | PRN

## 2015-01-28 NOTE — Assessment & Plan Note (Signed)
Well controlled on current regimen. Renal function stable, no changes today.  Lab Results  Component Value Date   CREATININE 1.05 01/25/2015   Lab Results  Component Value Date   NA 134* 01/25/2015   K 4.1 01/25/2015   CL 98 01/25/2015   CO2 28 01/25/2015

## 2015-01-28 NOTE — Assessment & Plan Note (Signed)
With new complaint suggestive of claudication accompanied by weak DP pulses and normal 02 ambulatory sats .  Refer to [facility] for repeat evaluation of aortic/LE circulation.

## 2015-01-28 NOTE — Assessment & Plan Note (Signed)
She is at goal, which is LDL < 70 given history of PAD and CVD .  Taking lipitor.  Lab Results  Component Value Date   CHOL 171 01/25/2015   HDL 64.60 01/25/2015   LDLCALC 67 01/25/2015   LDLDIRECT 76.1 06/09/2013   TRIG 199.0* 01/25/2015   CHOLHDL 3 01/25/2015   Lab Results  Component Value Date   ALT 26 01/25/2015   AST 22 01/25/2015   ALKPHOS 89 01/25/2015   BILITOT 0.3 01/25/2015

## 2015-01-28 NOTE — Addendum Note (Signed)
Addended by: Sherlene ShamsULLO, Lindsee Labarre L on: 01/28/2015 12:34 PM   Modules accepted: Orders

## 2015-01-28 NOTE — Assessment & Plan Note (Signed)
Lab Results  Component Value Date   HGBA1C 5.7 04/02/2014   Continue low gi diet.

## 2015-01-28 NOTE — Progress Notes (Signed)
Patient ID: Leah Preston, female   DOB: 1931-08-08, 81 y.o.   MRN: [id number]  Patient Active Problem List   Diagnosis Date Noted  . Dyspnea on exertion 01/28/2015  . TIA (transient ischemic attack) 01/28/2015  . Otitis media 05/23/2014  . Acute kidney failure 04/03/2014  . Renal artery stenosis 03/15/2014  . Impaired fasting glucose 06/14/2013  . Routine general medical examination at a health care facility 06/14/2013  . Peripheral vascular disease   . Screening for colon cancer 06/10/2011  . Atherosclerotic peripheral vascular disease 06/10/2011  . Hyperlipidemia   . Essential hypertension   . OA (osteoarthritis of spine)   . coronary artery disease   . History of spontaneous subarachnoid intracranial hemorrhage 02/17/2011    Subjective:  CC:   Chief Complaint  Patient presents with  . Follow-up    6 month saw Dr.Kowalski on 01/27/15 has had TIA last 1/16.    HPI:   Leah Preston is a 81 y.o. female who presents for   6 month follow up   Had a TIA at the end of January while vacationing in [place] which presented with aphasia  Was taken to ER, symptoms lasted < 1 hour and had resolved by time of Neurology evaluation. Was admitted to [facility] in [place] , [place] for 2 days/nights and reportedly had several MRIs.  Was discharged on Plavix.  She is back to her baseline which is notable for very mild dysarthria.   Cc: 1) dyspnea with exertion.  Not walkign much because legs start to feel weak from the knees down.   ECHO DONE YESTERDAY BY KOWALSKI ,  EF NORMAL.   NO STRESS TEST ORDEREDTO EVALUATE HER SHORTNESS OF BREATH since one was done  In 2015.   2) Insomnia.  .  Reports nightly difficulty trouble sleeping in bed.  Gets sleepy in the other room while watching TV bust as soon as she lies down in bed at 11:0  she becomes  wide awake,  sometimes until 3 am   Diet reviewed:  Drinks mostly water or wine with dinner,  Watches Tb,  No laptop or I pad use.   Bedroom is quiet and dark,  No bed partners.  Mattress is comfortable,  Temp is intentionally cool  Takes elavil 50 mg qhs. Was also take clorazepate without change.   PCP in [place] gave her samples of Belsomra, which did not work either.         Ambulatory sats dip to 92% but come right back up to 965.  Foot pulses are eark.  histoyr of stent to left leg by Schnier several years ago. no Prior PFT or ABIs     Past Medical History  Diagnosis Date  . Peripheral vascular disease     s/p carotid endarterectomy  . History of bladder problems   . Hypertension   . Hyperlipidemia   . OA (osteoarthritis of spine)     with chronic pain  . coronary artery disease   . subdurah/subarachnoid hemorrhage jun 2012    left parietal, sent to [facility]    Past Surgical History  Procedure Laterality Date  . Cesarean section    . Carotid endarterectomy  jan 2012    left, complicated by large hematoma, evacuated  . Fracture surgery  2006    Left radial pinning  . Joint replacement      Left TKR 2010, [facility]  . Bladder suspension  2003    [facility]  .  Cataract extraction, bilateral  August 2013  . Abdominal hysterectomy  1968    menorrhagia       The following portions of the patient's history were reviewed and updated as appropriate: Allergies, current medications, and problem list.    Review of Systems:   Patient denies headache, fevers, malaise, unintentional weight loss, skin rash, eye pain, sinus congestion and sinus pain, sore throat, dysphagia,  hemoptysis , cough, dyspnea, wheezing, chest pain, palpitations, orthopnea, edema, abdominal pain, nausea, melena, diarrhea, constipation, flank pain, dysuria, hematuria, urinary  Frequency, nocturia, numbness, tingling, seizures,  Focal weakness, Loss of consciousness,  Tremor, insomnia, depression, anxiety, and suicidal ideation.     History   Social History  . Marital Status: Single    Spouse Name: N/A  . Number of Children: N/A  .  Years of Education: N/A   Occupational History  . Not on file.   Social History Main Topics  . Smoking status: Former Smoker    Quit date: 06/07/1995  . Smokeless tobacco: Never Used  . Alcohol Use: No  . Drug Use: No  . Sexual Activity: Not on file   Other Topics Concern  . Not on file   Social History Narrative   Lives alone after divorce in 89. Daughter in unemployed and living with her since August 2008 (and granddaughter, 12).      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 ROM normal. No CVA tenderness. Lungs: clear to auscultation bilaterally Heart: regular rate and rhythm, S1, S2 normal, no murmur, click, rub or gallop Abdomen: soft, non-tender; bowel sounds normal; no masses,  no organomegaly Pulses: 2+ and symmetric Skin: Skin color, texture, turgor normal. No rashes or lesions Lymph nodes: Cervical, supraclavicular, and axillary nodes normal.  Assessment and Plan:  Hyperlipidemia She is at goal, which is LDL < 70 given history of PAD and CVD .  Taking lipitor.  Lab Results  Component Value Date   CHOL 171 01/25/2015   HDL 64.60 01/25/2015   LDLCALC 67 01/25/2015   LDLDIRECT 76.1 06/09/2013   TRIG 199.0* 01/25/2015   CHOLHDL 3 01/25/2015   Lab Results  Component Value Date   ALT 26 01/25/2015   AST 22 01/25/2015   ALKPHOS 89 01/25/2015   BILITOT 0.3 01/25/2015      Essential hypertension Well controlled on current regimen. Renal function  stable, no changes today.  Lab Results  Component Value Date   CREATININE 1.05 01/25/2015   Lab Results  Component Value Date   NA 134* 01/25/2015   K 4.1 01/25/2015   CL 98 01/25/2015   CO2 28 01/25/2015      History of spontaneous subarachnoid intracranial hemorrhage S/p left parietal bleed, with airlift to [facility] last year for stabilization and rehab.  Residual deficits of mild dysarthria.  ON Keppra lifelong.  Now on Plavix for TIA January 2016.  Risks and benefits reviewed /     coronary artery disease Exercise treadmill test done Nov 2015 by  Thana Farr was reported as normal.  Her disease has been medically managed.  She denies chest pain . Her dyspnea with exertion may be an anginal equivalent, so if PFTS and ABIs are nornal will refer back to Dr Erin Fulling for cardiac cath    Peripheral vascular disease With new complaint suggestive of claudication accompanied by weak DP pulses and normal 02 ambulatory sats .  Refer to [facility] for repeat evaluation of aortic/LE circulation.   Dyspnea on exertion Her ambulatory sats were 96% after a brief dip to 92%/  PFTs and vascular evaluation ordered. Will consider it as an anginal equivalent if PFTS are normal.    Impaired fasting glucose Lab Results  Component Value Date   HGBA1C 5.7 04/02/2014   Continue low gi diet.    TIA (transient ischemic attack) Now on Plavix for TAI whic occurred in Jan 2016 while in [place].  Transient aphasia, lasted < 1 hour .  Records requested.    Insomnia Trial of Belsomra failed per patiet,.  Previous meds not working (clorazepate).  Trial of restoril 7.5 mg   A total of 40 minutes was spent with patient more than half of which was spent in counseling patient on the above mentioned issues , reviewing and explaining recent labs and imaging studies done, and coordination of care.   Updated Medication List Outpatient Encounter Prescriptions as of 01/28/2015  Medication Sig  . amitriptyline  (ELAVIL) 50 MG tablet TAKE 1 TABLET (50 MG TOTAL) BY MOUTH DAILY.  Marland Kitchen amLODipine (NORVASC) 10 MG tablet TAKE 1 TABLET DAILY  . Ascorbic Acid (VITAMIN C PO) Take by mouth daily.    Marland Kitchen atorvastatin (LIPITOR) 40 MG tablet TAKE 1 TABLET DAILY  . Choline Fenofibrate (FENOFIBRIC ACID) 135 MG CPDR TAKE 1 CAPSULE AT BEDTIME  . clopidogrel (PLAVIX) 75 MG tablet Take 75 mg by mouth daily.  Marland Kitchen esomeprazole (NEXIUM) 40 MG capsule TAKE 1 CAPSULE DAILY  . glucose blood test strip Use as instructed to check blood sugars once daily  250.00  . levETIRAcetam (KEPPRA) 1000 MG tablet Take 1 tablet (1,000 mg total) by mouth 2 (two) times daily.  Marland Kitchen levETIRAcetam (KEPPRA) 1000 MG tablet TAKE 1 TABLET TWICE A DAY  . levothyroxine (SYNTHROID, LEVOTHROID) 50 MCG tablet TAKE 1 TABLET DAILY  . lisinopril (PRINIVIL,ZESTRIL) 20 MG tablet TAKE 1 TABLET DAILY  . meloxicam (MOBIC) 15 MG tablet TAKE 1 TABLET DAILY  . metoprolol (LOPRESSOR) 50 MG tablet TAKE 1 TABLET TWICE A DAY  . Multiple Vitamins-Minerals (CENTRUM SILVER PO) Take by mouth daily.    . [DISCONTINUED] clorazepate (TRANXENE) 7.5 MG tablet Take 1 tablet (7.5 mg total) by mouth daily.  . temazepam (RESTORIL) 7.5 MG capsule Take 1 capsule (7.5 mg total) by mouth at bedtime as needed for sleep.  . [DISCONTINUED] amitriptyline (ELAVIL) 50 MG tablet TAKE 1 TABLET DAILY  . [DISCONTINUED] amoxicillin-clavulanate (AUGMENTIN) 875-125 MG per tablet Take 1 tablet by mouth 2 (two) times daily. (Patient not taking: Reported on 01/28/2015)   No facility-administered encounter medications on file as of 01/28/2015.

## 2015-01-28 NOTE — Patient Instructions (Addendum)
You can try 7.5 mg temazepam before bedtime.  You may increase to 2 tablets after 2 days if no rest .  I have discontinued the chlorazepate  Your " leg tiredness" and shortness of breath may be due to emphysema but also may be due to worsening  circulation in your legs.    Pulmonary function tests to be done at [facility] to determine whey you are short of breath,  In the next few weeks   We will set you up to see dr Gilda CreaseSchnier again to evaluate your lower body circulation   Please return for fasting labs as your convenience

## 2015-01-28 NOTE — Assessment & Plan Note (Addendum)
S/p left parietal bleed, with airlift [facility] last year for stabilization and rehab.  Residual deficits of mild dysarthria.  ON Keppra lifelong.  Now on Plavix for TIA January 2016.  Risks and benefits reviewed /

## 2015-01-28 NOTE — Assessment & Plan Note (Signed)
Exercise treadmill test done Nov 2015 by Waldron SessionKowaslki was reported as normal.  Her disease has been medically managed.  She denies chest pain . Her dyspnea with exertion may be an anginal equivalent, so if PFTS and ABIs are nornal will refer back to Dr Laurena BeringKowalksi for cardiac cath

## 2015-01-28 NOTE — Assessment & Plan Note (Signed)
Now on Plavix for TAI whic occurred in Jan 2016 while in [place].  Transient aphasia, lasted < 1 hour .  Records requested.

## 2015-01-28 NOTE — Assessment & Plan Note (Signed)
Trial of Belsomra failed per patiet,.  Previous meds not working (clorazepate).  Trial of restoril 7.5 mg

## 2015-01-28 NOTE — Assessment & Plan Note (Signed)
PFTA and vascular evaluation ordered.

## 2015-02-01 ENCOUNTER — Other Ambulatory Visit: Payer: Medicare Other

## 2015-02-01 ENCOUNTER — Other Ambulatory Visit (INDEPENDENT_AMBULATORY_CARE_PROVIDER_SITE_OTHER): Payer: Medicare Other

## 2015-02-01 DIAGNOSIS — R7301 Impaired fasting glucose: Secondary | ICD-10-CM | POA: Diagnosis not present

## 2015-02-01 DIAGNOSIS — I70219 Atherosclerosis of native arteries of extremities with intermittent claudication, unspecified extremity: Secondary | ICD-10-CM | POA: Diagnosis not present

## 2015-02-01 DIAGNOSIS — R5383 Other fatigue: Secondary | ICD-10-CM

## 2015-02-01 DIAGNOSIS — R0600 Dyspnea, unspecified: Secondary | ICD-10-CM

## 2015-02-01 DIAGNOSIS — R0609 Other forms of dyspnea: Secondary | ICD-10-CM

## 2015-02-01 DIAGNOSIS — Z1159 Encounter for screening for other viral diseases: Secondary | ICD-10-CM

## 2015-02-01 LAB — LIPID PANEL
Cholesterol: 176 mg/dL (ref 0–200)
HDL: 66.1 mg/dL (ref >39.00)
NonHDL: 109.9
Total CHOL/HDL Ratio: 3
Triglycerides: 242 mg/dL — ABNORMAL HIGH (ref 0.0–149.0)
VLDL: 48.4 mg/dL — AB (ref 0.0–40.0)

## 2015-02-01 LAB — TSH: TSH: 1.04 u[IU]/mL (ref 0.35–4.50)

## 2015-02-01 LAB — CBC WITH DIFFERENTIAL/PLATELET
BASOS PCT: 0.8 % (ref 0.0–3.0)
Basophils Absolute: 0.1 10*3/uL (ref 0.0–0.1)
EOS PCT: 2.4 % (ref 0.0–5.0)
Eosinophils Absolute: 0.2 10*3/uL (ref 0.0–0.7)
HEMATOCRIT: 36.7 % (ref 36.0–46.0)
HEMOGLOBIN: 12.5 g/dL (ref 12.0–15.0)
LYMPHS ABS: 2.8 10*3/uL (ref 0.7–4.0)
Lymphocytes Relative: 33.9 % (ref 12.0–46.0)
MCHC: 34.1 g/dL (ref 30.0–36.0)
MCV: 95.8 fl (ref 78.0–100.0)
MONOS PCT: 8.3 % (ref 3.0–12.0)
Monocytes Absolute: 0.7 10*3/uL (ref 0.1–1.0)
Neutro Abs: 4.5 10*3/uL (ref 1.4–7.7)
Neutrophils Relative %: 54.6 % (ref 43.0–77.0)
Platelets: 194 10*3/uL (ref 150.0–400.0)
RBC: 3.83 Mil/uL — AB (ref 3.87–5.11)
RDW: 13.6 % (ref 11.5–15.5)
WBC: 8.2 10*3/uL (ref 4.0–10.5)

## 2015-02-01 LAB — MICROALBUMIN / CREATININE URINE RATIO
Creatinine,U: 72.4 mg/dL
Microalb Creat Ratio: 11.6 mg/g (ref 0.0–30.0)
Microalb, Ur: 8.4 mg/dL — ABNORMAL HIGH (ref 0.0–1.9)

## 2015-02-01 LAB — HEMOGLOBIN A1C: Hgb A1c MFr Bld: 5.8 % (ref 4.6–6.5)

## 2015-02-01 LAB — COMPREHENSIVE METABOLIC PANEL
ALBUMIN: 4.1 g/dL (ref 3.5–5.2)
ALT: 21 U/L (ref 0–35)
AST: 17 U/L (ref 0–37)
Alkaline Phosphatase: 80 U/L (ref 39–117)
BUN: 24 mg/dL — ABNORMAL HIGH (ref 6–23)
CHLORIDE: 99 meq/L (ref 96–112)
CO2: 32 mEq/L (ref 19–32)
CREATININE: 1 mg/dL (ref 0.40–1.20)
Calcium: 10.2 mg/dL (ref 8.4–10.5)
GFR: 56.45 mL/min — AB (ref >60.00)
Glucose, Bld: 106 mg/dL — ABNORMAL HIGH (ref 70–99)
Potassium: 5.1 mEq/L (ref 3.5–5.1)
Sodium: 136 mEq/L (ref 135–145)
TOTAL PROTEIN: 7.1 g/dL (ref 6.0–8.3)
Total Bilirubin: 0.5 mg/dL (ref 0.2–1.2)

## 2015-02-01 LAB — LDL CHOLESTEROL, DIRECT: Direct LDL: 80 mg/dL

## 2015-02-02 LAB — HEPATITIS C ANTIBODY: HCV Ab: NEGATIVE

## 2015-02-04 ENCOUNTER — Encounter: Payer: Self-pay | Admitting: *Deleted

## 2015-02-16 ENCOUNTER — Other Ambulatory Visit: Payer: Self-pay | Admitting: Internal Medicine

## 2015-02-16 ENCOUNTER — Ambulatory Visit: Payer: Medicare Other | Attending: Internal Medicine

## 2015-02-16 DIAGNOSIS — Z87891 Personal history of nicotine dependence: Secondary | ICD-10-CM

## 2015-02-16 DIAGNOSIS — R0602 Shortness of breath: Secondary | ICD-10-CM

## 2015-02-16 DIAGNOSIS — R0609 Other forms of dyspnea: Secondary | ICD-10-CM

## 2015-02-18 ENCOUNTER — Other Ambulatory Visit: Payer: Self-pay | Admitting: *Deleted

## 2015-02-18 MED ORDER — TEMAZEPAM 7.5 MG PO CAPS: 7.5000 mg | ORAL_CAPSULE | Freq: Every evening | ORAL | Status: DC | PRN

## 2015-02-18 NOTE — Telephone Encounter (Signed)
Ok to refill,  printed rx  

## 2015-02-18 NOTE — Telephone Encounter (Signed)
Ok refill? Last visit 01/28/15

## 2015-02-18 NOTE — Telephone Encounter (Signed)
Rx faxed 6.2.16 to [facility]

## 2015-02-23 ENCOUNTER — Encounter: Payer: Self-pay | Admitting: *Deleted

## 2015-02-23 ENCOUNTER — Emergency Department: Payer: Medicare Other

## 2015-02-23 ENCOUNTER — Telehealth: Payer: Self-pay | Admitting: Internal Medicine

## 2015-02-23 ENCOUNTER — Emergency Department
Admission: EM | Admit: 2015-02-23 | Discharge: 2015-02-23 | Disposition: A | Discharge status: Home / Self Care | Payer: Medicare Other | Attending: Emergency Medicine | Admitting: Emergency Medicine

## 2015-02-23 DIAGNOSIS — Z791 Long term (current) use of non-steroidal anti-inflammatories (NSAID): Secondary | ICD-10-CM | POA: Insufficient documentation

## 2015-02-23 DIAGNOSIS — R519 Headache, unspecified: Secondary | ICD-10-CM

## 2015-02-23 DIAGNOSIS — I10 Essential (primary) hypertension: Secondary | ICD-10-CM | POA: Diagnosis not present

## 2015-02-23 DIAGNOSIS — G629 Polyneuropathy, unspecified: Secondary | ICD-10-CM | POA: Insufficient documentation

## 2015-02-23 DIAGNOSIS — Z7901 Long term (current) use of anticoagulants: Secondary | ICD-10-CM | POA: Diagnosis not present

## 2015-02-23 DIAGNOSIS — R202 Paresthesia of skin: Secondary | ICD-10-CM | POA: Diagnosis present

## 2015-02-23 DIAGNOSIS — Z79899 Other long term (current) drug therapy: Secondary | ICD-10-CM | POA: Diagnosis not present

## 2015-02-23 DIAGNOSIS — R51 Headache: Secondary | ICD-10-CM | POA: Insufficient documentation

## 2015-02-23 DIAGNOSIS — Z87891 Personal history of nicotine dependence: Secondary | ICD-10-CM | POA: Diagnosis not present

## 2015-02-23 LAB — COMPREHENSIVE METABOLIC PANEL
ALT: 24 U/L (ref 14–54)
ANION GAP: 9 (ref 5–15)
AST: 20 U/L (ref 15–41)
Albumin: 4.4 g/dL (ref 3.5–5.0)
Alkaline Phosphatase: 72 U/L (ref 38–126)
BILIRUBIN TOTAL: 0.8 mg/dL (ref 0.3–1.2)
BUN: 22 mg/dL — ABNORMAL HIGH (ref 6–20)
CO2: 29 mmol/L (ref 22–32)
Calcium: 9.8 mg/dL (ref 8.9–10.3)
Chloride: 100 mmol/L — ABNORMAL LOW (ref 101–111)
Creatinine, Ser: 0.91 mg/dL (ref 0.44–1.00)
GFR calc Af Amer: >60 mL/min (ref >60)
GFR calc non Af Amer: 58 mL/min — ABNORMAL LOW (ref >60)
Glucose, Bld: 91 mg/dL (ref 65–99)
Potassium: 4.7 mmol/L (ref 3.5–5.1)
Sodium: 138 mmol/L (ref 135–145)
TOTAL PROTEIN: 7.6 g/dL (ref 6.5–8.1)

## 2015-02-23 LAB — CBC
HEMATOCRIT: 36.3 % (ref 35.0–47.0)
Hemoglobin: 12.3 g/dL (ref 12.0–16.0)
MCH: 32.5 pg (ref 26.0–34.0)
MCHC: 33.8 g/dL (ref 32.0–36.0)
MCV: 96.3 fL (ref 80.0–100.0)
PLATELETS: 193 10*3/uL (ref 150–440)
RBC: 3.77 MIL/uL — ABNORMAL LOW (ref 3.80–5.20)
RDW: 12.9 % (ref 11.5–14.5)
WBC: 8.1 10*3/uL (ref 3.6–11.0)

## 2015-02-23 LAB — PROTIME-INR
INR: 0.97
Prothrombin Time: 13.1 seconds (ref 11.4–15.0)

## 2015-02-23 LAB — DIFFERENTIAL
Basophils Absolute: 0.1 10*3/uL (ref 0–0.1)
Basophils Relative: 1 %
EOS ABS: 0.3 10*3/uL (ref 0–0.7)
EOS PCT: 3 %
Lymphocytes Relative: 35 %
Lymphs Abs: 2.8 10*3/uL (ref 1.0–3.6)
MONOS PCT: 12 %
Monocytes Absolute: 0.9 10*3/uL (ref 0.2–0.9)
NEUTROS PCT: 49 %
Neutro Abs: 4 10*3/uL (ref 1.4–6.5)

## 2015-02-23 LAB — APTT: aPTT: 27 seconds (ref 24–36)

## 2015-02-23 LAB — TROPONIN I

## 2015-02-23 NOTE — Discharge Instructions (Signed)

## 2015-02-23 NOTE — ED Provider Notes (Signed)
[facility] Emergency Department Provider Note  ____________________________________________  Time seen: 5:00 PM  I have reviewed the triage vital signs and the nursing notes.   HISTORY  Chief Complaint Tingling    HPI Etter SjogrenRena D Reaser is a 81 y.o. female who complains of tingling in the hands and feet for the past month. She called her doctor's office this morning and reported these symptoms as well as a headache and they reported that she would not go to see her physician today and so she should come to the ER. She denies any weakness or syncope. No vision changes. No nausea vomiting chest pain shortness of breath abdominal pain back pain.  No recent trauma   Past Medical History  Diagnosis Date  . Peripheral vascular disease     s/p carotid endarterectomy  . History of bladder problems   . Hypertension   . Hyperlipidemia   . OA (osteoarthritis of spine)     with chronic pain  . coronary artery disease   . subdurah/subarachnoid hemorrhage jun 2012    left parietal, [facility]    Patient Active Problem List   Diagnosis Date Noted  . Dyspnea on exertion 01/28/2015  . TIA (transient ischemic attack) 01/28/2015  . Insomnia 01/28/2015  . Otitis media 05/23/2014  . Acute kidney failure 04/03/2014  . Renal artery stenosis 03/15/2014  . Impaired fasting glucose 06/14/2013  . Routine general medical examination at a health care facility 06/14/2013  . Peripheral vascular disease   . Screening for colon cancer 06/10/2011  . Atherosclerotic peripheral vascular disease 06/10/2011  . Hyperlipidemia   . Essential hypertension   . OA (osteoarthritis of spine)   . coronary artery disease   . History of spontaneous subarachnoid intracranial hemorrhage 02/17/2011    Past Surgical History  Procedure Laterality Date  . Cesarean section    . Carotid endarterectomy  jan 2012    left, complicated by large hematoma, evacuated  . Fracture surgery  2006     Left radial pinning  . Joint replacement      Left TKR 2010, [facility]  . Bladder suspension  2003    [facility]  . Cataract extraction, bilateral  August 2013  . Abdominal hysterectomy  1968    menorrhagia    Current Outpatient Rx  Name  Route  Sig  Dispense  Refill  . amitriptyline (ELAVIL) 50 MG tablet      TAKE 1 TABLET (50 MG TOTAL) BY MOUTH DAILY.   30 tablet   2   . amLODipine (NORVASC) 10 MG tablet      TAKE 1 TABLET DAILY   90 tablet   1   . Ascorbic Acid (VITAMIN C PO)   Oral   Take by mouth daily.           Marland Kitchen. atorvastatin (LIPITOR) 40 MG tablet      TAKE 1 TABLET DAILY   90 tablet   1   . Choline Fenofibrate (FENOFIBRIC ACID) 135 MG CPDR      TAKE 1 CAPSULE AT BEDTIME   90 capsule   0   . clopidogrel (PLAVIX) 75 MG tablet   Oral   Take 75 mg by mouth daily.         Marland Kitchen. esomeprazole (NEXIUM) 40 MG capsule      TAKE 1 CAPSULE DAILY   90 capsule   1   . glucose blood test strip      Use as instructed to check  blood sugars once daily  250.00   100 each   12   . levETIRAcetam (KEPPRA) 1000 MG tablet   Oral   Take 1 tablet (1,000 mg total) by mouth 2 (two) times daily.   180 tablet   3   . levETIRAcetam (KEPPRA) 1000 MG tablet      TAKE 1 TABLET TWICE A DAY   180 tablet   3   . levothyroxine (SYNTHROID, LEVOTHROID) 50 MCG tablet      TAKE 1 TABLET DAILY   90 tablet   1   . lisinopril (PRINIVIL,ZESTRIL) 20 MG tablet      TAKE 1 TABLET DAILY   90 tablet   0     Must keep appt on 01/28/15 for further refills   . meloxicam (MOBIC) 15 MG tablet      TAKE 1 TABLET DAILY   90 tablet   0   . metoprolol (LOPRESSOR) 50 MG tablet      TAKE 1 TABLET TWICE A DAY   180 tablet   1   . Multiple Vitamins-Minerals (CENTRUM SILVER PO)   Oral   Take by mouth daily.           . temazepam (RESTORIL) 7.5 MG capsule   Oral   Take 1 capsule (7.5 mg total) by mouth at bedtime as needed for sleep.   30 capsule   2     Allergies Oxycodone  hcl and Tetanus toxoids  Family History  Problem Relation Age of Onset  . Stroke Mother     Social History History  Substance Use Topics  . Smoking status: Former Smoker    Quit date: 06/07/1995  . Smokeless tobacco: Never Used  . Alcohol Use: No    Review of Systems  Constitutional: No fever or chills. No weight changes Eyes:No blurry vision or double vision.  ENT: No sore throat. Cardiovascular: No chest pain. Respiratory: No dyspnea or cough. Gastrointestinal: Negative for abdominal pain, vomiting and diarrhea.  No BRBPR or melena. Genitourinary: Negative for dysuria, urinary retention, bloody urine, or difficulty urinating. Musculoskeletal: Negative for back pain. No joint swelling or pain. Skin: Negative for rash. Neurological: Throbbing headache on the top of the head, tingling in bilateral hands and feet. Psychiatric:No anxiety or depression.   Endocrine:No hot/cold intolerance, changes in energy, or sleep difficulty.  10-point ROS otherwise negative.  ____________________________________________   PHYSICAL EXAM:  VITAL SIGNS: ED Triage Vitals  Enc Vitals Group     BP 02/23/15 1432 158/68 mmHg     Pulse Rate 02/23/15 1432 72     Resp 02/23/15 1432 20     Temp 02/23/15 1432 98.2 F (36.8 C)     Temp Source 02/23/15 1432 Oral     SpO2 02/23/15 1432 100 %     Weight 02/23/15 1432 130 lb (58.968 kg)     Height 02/23/15 1432 5' (1.524 m)     Head Cir --      Peak Flow --      Pain Score --      Pain Loc --      Pain Edu? --      Excl. in GC? --      Constitutional: Alert and oriented. Well appearing and in no distress. Eyes: No scleral icterus. No conjunctival pallor. PERRL. EOMI ENT   Head: Normocephalic and atraumatic.   Nose: No congestion/rhinnorhea. No septal hematoma   Mouth/Throat: MMM, no pharyngeal erythema. No peritonsillar mass. No uvula shift.  Neck: No stridor. No SubQ emphysema. No  meningismus. Hematological/Lymphatic/Immunilogical: No cervical lymphadenopathy. Cardiovascular: RRR. Normal and symmetric distal pulses are present in all extremities. No murmurs, rubs, or gallops. Respiratory: Normal respiratory effort without tachypnea nor retractions. Breath sounds are clear and equal bilaterally. No wheezes/rales/rhonchi. Gastrointestinal: Soft and nontender. No distention. There is no CVA tenderness.  No rebound, rigidity, or guarding. Genitourinary: deferred Musculoskeletal: Nontender with normal range of motion in all extremities. No joint effusions.  No lower extremity tenderness.  No edema. Neurologic:   Normal speech and language.  CN 2-10 normal. Motor grossly intact. No pronator drift.  Normal gait. No gross focal neurologic deficits are appreciated.  Skin:  Skin is warm, dry and intact. No rash noted.  No petechiae, purpura, or bullae. Psychiatric: Mood and affect are normal. Speech and behavior are normal. Patient exhibits appropriate insight and judgment.  ____________________________________________    LABS (pertinent positives/negatives) (all labs ordered are listed, but only abnormal results are displayed) Labs Reviewed  CBC - Abnormal; Notable for the following:    RBC 3.77 (*)    All other components within normal limits  COMPREHENSIVE METABOLIC PANEL - Abnormal; Notable for the following:    Chloride 100 (*)    BUN 22 (*)    GFR calc non Af Amer 58 (*)    All other components within normal limits  PROTIME-INR  APTT  DIFFERENTIAL  TROPONIN I  CBG MONITORING, ED   ____________________________________________   EKG    ____________________________________________    RADIOLOGY  CT head unremarkable  ____________________________________________   PROCEDURES  ____________________________________________   INITIAL IMPRESSION / ASSESSMENT AND PLAN / ED COURSE  Pertinent labs & imaging results that were available during my care  of the patient were reviewed by me and considered in my medical decision making (see chart for details).  No evidence of stroke intracranial hemorrhage vitamin deficiency or other acute pathology. Patient has chronic paresthesia or peripheral neuropathy, we'll refer her back to primary care for further monitoring of her symptoms. Patient is continue all home medicines at this time. She does report compliance with her aspirin and Plavix  ____________________________________________   FINAL CLINICAL IMPRESSION(S) / ED DIAGNOSES  Final diagnoses:  Acute nonintractable headache, unspecified headache type  Peripheral neuropathy      Sharman Cheek, MD 02/23/15 [phone]

## 2015-02-23 NOTE — Telephone Encounter (Signed)
Patient called for results on PFT's but patient also called due to head pounding feeling and tingling, patient also c/o tingling and numbness to extreme ties, advised patient MD not in office and to go to ER now , patient stated she would go to ER now. FYI

## 2015-02-23 NOTE — ED Notes (Signed)
Pt here with c/o "tingling" in all extremities x 2 weeks.  Pt ambulatory to room 15 without any problems.

## 2015-02-23 NOTE — ED Notes (Addendum)
Pt reports having tingling in extremities for the last month, pt has been seen by MD, grips equal, face symmetrical, pt denies any other symptoms

## 2015-02-24 ENCOUNTER — Telehealth: Payer: Self-pay

## 2015-02-24 DIAGNOSIS — I70209 Unspecified atherosclerosis of native arteries of extremities, unspecified extremity: Secondary | ICD-10-CM

## 2015-02-24 NOTE — Telephone Encounter (Signed)
Please advise. Patient seen at ED on 02/23/15 [facility]

## 2015-02-24 NOTE — Telephone Encounter (Signed)
Did th ER identify a new problem that she needs referral for or is the referral for follow up on prior diagnosis of peripheral vascular disease?

## 2015-02-24 NOTE — Telephone Encounter (Signed)
The pt called hoping to get a referral to a cardiologist in [facility] for her PAD.  She states she wants to go to [facility] instead of continuing to see Dr.Schnier

## 2015-02-24 NOTE — Telephone Encounter (Signed)
Referral is for PAD no new DX noted in chart.

## 2015-03-10 LAB — PULMONARY FUNCTION TEST ARMC ONLY

## 2015-03-11 ENCOUNTER — Other Ambulatory Visit: Payer: Self-pay | Admitting: Internal Medicine

## 2015-03-11 DIAGNOSIS — I70209 Unspecified atherosclerosis of native arteries of extremities, unspecified extremity: Secondary | ICD-10-CM

## 2015-03-11 DIAGNOSIS — R0609 Other forms of dyspnea: Secondary | ICD-10-CM

## 2015-04-02 ENCOUNTER — Other Ambulatory Visit: Payer: Self-pay | Admitting: Internal Medicine

## 2015-04-13 ENCOUNTER — Other Ambulatory Visit: Payer: Self-pay | Admitting: Internal Medicine

## 2015-04-14 ENCOUNTER — Other Ambulatory Visit: Payer: Self-pay

## 2015-04-14 MED ORDER — TEMAZEPAM 7.5 MG PO CAPS: 7.5000 mg | ORAL_CAPSULE | Freq: Every evening | ORAL | Status: DC | PRN

## 2015-04-16 ENCOUNTER — Other Ambulatory Visit: Payer: Self-pay | Admitting: Internal Medicine

## 2015-04-19 ENCOUNTER — Telehealth: Payer: Self-pay | Admitting: *Deleted

## 2015-04-19 MED ORDER — CLORAZEPATE DIPOTASSIUM 7.5 MG PO TABS: 7.5000 mg | ORAL_TABLET | Freq: Every day | ORAL | Status: AC

## 2015-04-19 NOTE — Telephone Encounter (Signed)
Call from pharmacy requesting Clorazepate 7.5mg .  Rx no longer on med list.  Please advise refill

## 2015-04-19 NOTE — Telephone Encounter (Signed)
90 day supply authorized and PRINTED 

## 2015-04-19 NOTE — Telephone Encounter (Signed)
rx faxed

## 2015-04-24 ENCOUNTER — Other Ambulatory Visit: Payer: Self-pay | Admitting: Internal Medicine

## 2015-04-27 ENCOUNTER — Encounter
Admission: RE | Disposition: A | Discharge status: Home / Self Care | Payer: Self-pay | Source: Ambulatory Visit | Attending: Vascular Surgery

## 2015-04-27 ENCOUNTER — Ambulatory Visit
Admission: RE | Admit: 2015-04-27 | Discharge: 2015-04-27 | Disposition: A | Discharge status: Home / Self Care | Payer: Medicare Other | Source: Ambulatory Visit | Attending: Vascular Surgery | Admitting: Vascular Surgery

## 2015-04-27 ENCOUNTER — Encounter: Payer: Self-pay | Admitting: *Deleted

## 2015-04-27 DIAGNOSIS — I10 Essential (primary) hypertension: Secondary | ICD-10-CM | POA: Insufficient documentation

## 2015-04-27 DIAGNOSIS — Z79899 Other long term (current) drug therapy: Secondary | ICD-10-CM | POA: Diagnosis not present

## 2015-04-27 DIAGNOSIS — J9811 Atelectasis: Secondary | ICD-10-CM | POA: Insufficient documentation

## 2015-04-27 DIAGNOSIS — E780 Pure hypercholesterolemia: Secondary | ICD-10-CM | POA: Insufficient documentation

## 2015-04-27 DIAGNOSIS — I6529 Occlusion and stenosis of unspecified carotid artery: Secondary | ICD-10-CM | POA: Insufficient documentation

## 2015-04-27 DIAGNOSIS — Z7902 Long term (current) use of antithrombotics/antiplatelets: Secondary | ICD-10-CM | POA: Diagnosis not present

## 2015-04-27 DIAGNOSIS — E785 Hyperlipidemia, unspecified: Secondary | ICD-10-CM | POA: Insufficient documentation

## 2015-04-27 DIAGNOSIS — I701 Atherosclerosis of renal artery: Secondary | ICD-10-CM | POA: Insufficient documentation

## 2015-04-27 DIAGNOSIS — I70223 Atherosclerosis of native arteries of extremities with rest pain, bilateral legs: Secondary | ICD-10-CM | POA: Diagnosis present

## 2015-04-27 HISTORY — PX: PERIPHERAL VASCULAR CATHETERIZATION: SHX172C

## 2015-04-27 LAB — CREATININE, SERUM
CREATININE: 1.16 mg/dL — AB (ref 0.44–1.00)
GFR calc Af Amer: 49 mL/min — ABNORMAL LOW (ref >60)
GFR calc non Af Amer: 43 mL/min — ABNORMAL LOW (ref >60)

## 2015-04-27 LAB — BUN: BUN: 22 mg/dL — AB (ref 6–20)

## 2015-04-27 SURGERY — LOWER EXTREMITY ANGIOGRAPHY
Anesthesia: Moderate Sedation | Laterality: Left

## 2015-04-27 MED ORDER — SODIUM BICARBONATE BOLUS VIA INFUSION
INTRAVENOUS | Status: AC
  Administered 2015-04-27: 08:00:00 via INTRAVENOUS
  Filled 2015-04-27: qty 1

## 2015-04-27 MED ORDER — ONDANSETRON HCL 4 MG/2ML IJ SOLN: 4.0000 mg | Freq: Four times a day (QID) | INTRAMUSCULAR | Status: DC | PRN

## 2015-04-27 MED ORDER — SODIUM BICARBONATE 8.4 % IV SOLN
INTRAVENOUS | Status: AC
  Administered 2015-04-27 (×2): via INTRAVENOUS
  Filled 2015-04-27: qty 500

## 2015-04-27 MED ORDER — LIDOCAINE HCL (PF) 1 % IJ SOLN
INTRAMUSCULAR | Status: AC
  Filled 2015-04-27: qty 10

## 2015-04-27 MED ORDER — MIDAZOLAM HCL 2 MG/2ML IJ SOLN
INTRAMUSCULAR | Status: DC | PRN
  Administered 2015-04-27: 2 mg via INTRAVENOUS

## 2015-04-27 MED ORDER — IOHEXOL 300 MG/ML  SOLN
INTRAMUSCULAR | Status: DC | PRN
  Administered 2015-04-27: 110 mL via INTRA_ARTERIAL

## 2015-04-27 MED ORDER — FENTANYL CITRATE (PF) 100 MCG/2ML IJ SOLN
INTRAMUSCULAR | Status: AC
  Filled 2015-04-27: qty 2

## 2015-04-27 MED ORDER — TRAMADOL HCL 50 MG PO TABS: 50.0000 mg | ORAL_TABLET | Freq: Four times a day (QID) | ORAL | Status: DC | PRN

## 2015-04-27 MED ORDER — MIDAZOLAM HCL 5 MG/5ML IJ SOLN
INTRAMUSCULAR | Status: AC
  Filled 2015-04-27: qty 5

## 2015-04-27 MED ORDER — HYDRALAZINE HCL 20 MG/ML IJ SOLN
10.0000 mg | Freq: Once | INTRAMUSCULAR | Status: AC
  Administered 2015-04-27: 10 mg via INTRAVENOUS

## 2015-04-27 MED ORDER — HEPARIN SODIUM (PORCINE) 1000 UNIT/ML IJ SOLN
INTRAMUSCULAR | Status: AC
  Filled 2015-04-27: qty 1

## 2015-04-27 MED ORDER — HYDRALAZINE HCL 20 MG/ML IJ SOLN
INTRAMUSCULAR | Status: AC
Start: 2015-04-27 — End: 2015-04-27
  Filled 2015-04-27: qty 1

## 2015-04-27 MED ORDER — HEPARIN (PORCINE) IN NACL 2-0.9 UNIT/ML-% IJ SOLN
INTRAMUSCULAR | Status: AC
  Filled 2015-04-27: qty 1000

## 2015-04-27 MED ORDER — LABETALOL HCL 5 MG/ML IV SOLN: 10.0000 mg | INTRAVENOUS | Status: DC | PRN

## 2015-04-27 MED ORDER — SODIUM CHLORIDE 0.9 % IV SOLN
INTRAVENOUS | Status: DC
Start: 2015-04-27 — End: 2015-04-27
  Administered 2015-04-27 (×2): via INTRAVENOUS

## 2015-04-27 MED ORDER — CEFAZOLIN SODIUM 1-5 GM-% IV SOLN
1.0000 g | Freq: Once | INTRAVENOUS | Status: AC
  Administered 2015-04-27: 1 g via INTRAVENOUS

## 2015-04-27 MED ORDER — HYDRALAZINE HCL 20 MG/ML IJ SOLN: 5.0000 mg | INTRAMUSCULAR | Status: DC | PRN

## 2015-04-27 MED ORDER — HEPARIN SODIUM (PORCINE) 1000 UNIT/ML IJ SOLN
INTRAMUSCULAR | Status: DC | PRN
  Administered 2015-04-27: 2000 [IU] via INTRAVENOUS
  Administered 2015-04-27: 3000 [IU] via INTRAVENOUS

## 2015-04-27 MED ORDER — FENTANYL CITRATE (PF) 100 MCG/2ML IJ SOLN
INTRAMUSCULAR | Status: DC | PRN
  Administered 2015-04-27: 50 ug via INTRAVENOUS

## 2015-04-27 MED ORDER — HYDROMORPHONE HCL 1 MG/ML IJ SOLN: 0.5000 mg | INTRAMUSCULAR | Status: DC | PRN

## 2015-04-27 SURGICAL SUPPLY — 36 items
BALLN ARMADA 10X40X80 (BALLOONS) ×3
BALLN DORADO 8X40X80 (BALLOONS) ×3
BALLN ULTRVRSE  6X40X75C (BALLOONS) ×2
BALLN ULTRVRSE 6X40X75C (BALLOONS) ×1
BALLN ULTRVRSE 8X40X75C (BALLOONS) ×3
BALLON ARMADA 4X20X80 (BALLOONS) ×3 IMPLANT
BALLOON ARMADA 10X40X80 (BALLOONS) ×1 IMPLANT
BALLOON DORADO 8X40X80 (BALLOONS) ×1 IMPLANT
BALLOON ULTRVRSE 6X40X75C (BALLOONS) ×1 IMPLANT
BALLOON ULTRVRSE 8X40X75C (BALLOONS) ×1 IMPLANT
CANNULA NASAL 8 HUDSON (TUBING) ×3 IMPLANT
CATH KMP 5.0X100 (CATHETERS) ×3 IMPLANT
CATH PIG 70CM (CATHETERS) ×3 IMPLANT
CATH SLIP KMP 65CM 5FR (CATHETERS) ×3 IMPLANT
DEVICE INFLATION 20/30 (MISCELLANEOUS) ×3 IMPLANT
DEVICE SAFEGUARD 24CM (GAUZE/BANDAGES/DRESSINGS) ×6 IMPLANT
DEVICE STARCLOSE SE CLOSURE (Vascular Products) ×3 IMPLANT
GLIDEWIRE ADV .035X260CM (WIRE) ×3 IMPLANT
PACK ANGIOGRAPHY (CUSTOM PROCEDURE TRAY) ×3 IMPLANT
SET INTRO CAPELLA COAXIAL (SET/KITS/TRAYS/PACK) ×3 IMPLANT
SHEATH BRITE TIP 5FRX11 (SHEATH) ×3 IMPLANT
SHEATH BRITE TIP 6FRX11 (SHEATH) ×3 IMPLANT
SHEATH BRITE TIP 8FRX11 (SHEATH) ×3 IMPLANT
SHEATH FLEXOR 9FRX30 (SHEATH) ×3 IMPLANT
SHEATH RAABE 8FRX55 (SHEATH) ×3 IMPLANT
STENT LIFESTAR 14X40 (Permanent Stent) ×3 IMPLANT
STENT OMNILINK ELITE 7X29X80 (Permanent Stent) ×6 IMPLANT
STENT OMNILINK ELITE 9X19X80 (Permanent Stent) ×6 IMPLANT
STENT PALMAZ GENESIS (Permanent Stent) IMPLANT
STENT VIABAHN 13X25X120 (Permanent Stent) IMPLANT
SYR MEDRAD MARK V 150ML (SYRINGE) ×3 IMPLANT
TUBING CONTRAST HIGH PRESS 72 (TUBING) ×3 IMPLANT
WIRE AMPLATZ SSTIFF .035X260CM (WIRE) ×3 IMPLANT
WIRE J 3MM .035X145CM (WIRE) ×3 IMPLANT
WIRE MAGIC TOR.035 180C (WIRE) ×3 IMPLANT
WIRE ROSEN-J .035X260CM (WIRE) ×3 IMPLANT

## 2015-04-27 NOTE — Discharge Instructions (Signed)

## 2015-04-27 NOTE — Op Note (Signed)
[facility]  Percutaneous Study/Intervention Procedural Note   Date of Surgery: 04/27/2015  Surgeon:Mikhayla Phillis, Dolores Lory   Pre-operative Diagnosis: Atherosclerotic occlusive disease bilateral lower extremities with ulceration and rest pain Post-operative diagnosis:  Same  Procedure(s) Performed:  1.  Abdominal aortogram  2.  Bilateral lower extremity distal runoff  3.  Introduction catheter into aorta right femoral artery approach  4.  Introduction catheter into aorta left femoral artery approach  5.  Percutaneous transluminal angioplasty and stent placement of the infrarenal abdominal aorta  6.  Percutaneous transluminal and plasty and stent placement of the right common iliac artery  7.  Percutaneous transluminal and plasty and stent placement of the left common iliac artery  Anesthesia: Conscious sedation  Sheath: 6 French sheath retrograde right common femoral artery; 8 French sheath retrograde left common femoral artery  Contrast: 110 cc  Fluoroscopy Time: 13.8 minutes  Indications:  Patient presented to the office with multiple small ulcerations of the feet and increasing rest pain. Physical examination as well as noninvasive studies demonstrated atherosclerotic occlusive disease. Patient was advised to undergo angiography with the hope for intervention for limb salvage the risks and benefits were reviewed all questions were answered and the patient has agreed to proceed with angiography and intervention.  Procedure:  Alisha Bacus Johnsonis a 81 y.o. female who was identified and appropriate procedural time out was performed.  The patient was then placed supine on the table and prepped and draped in the usual sterile fashion.  Ultrasound was used to evaluate the right common femoral artery.  It was echolucent indicating it was patent.   A micropuncture needle was used to access the right common femoral artery under direct ultrasound guidance and a permanent image  was performed.  A 0.035 J wire was advanced without resistance and a 5Fr sheath was placed. The J-wire would not advance into the aorta and a hand injection contrast was used to demonstrate a subtotal occlusion of the right common iliac artery. This was negotiated with an [place] and subsequently a 3000 units bolus of heparin was given and a 4 x 2 balloon used to antroplasty this lesion to allow for easier passage of the pigtail catheter.  The catheter catheter was then advanced to the level of T12 and AP projection of the aorta was obtained. Subtotal occlusion of the mid infrarenal aorta was noted at the level of the IMA origin which did not allow for the pigtail catheter to be repositioned above the by bifurcation proper however pigtail was readjusted to the level of this lesion was pulled back approximately 4 cm and subsequently bilateral oblique views of the pelvis were obtained. The detector was then repositioned AP and AP bilateral lower extremity runoff was obtained from the level of the femorals to the mid tibials. After review of these lesions it appears the left common femoral is more suitable compared to the right secondary to common femoral disease and plaque and therefore the larger sheath will be placed on the left the right side was then upsized over the wire to a 6 [place] sheath.  Ultrasound was used to evaluate the left common femoral artery.  It was echolucent indicating it was patent .  A digital ultrasound image was acquired. .  A micropuncture needle was used to access the left common femoral artery under direct ultrasound guidance and a permanent image was performed.  A 0.035 J wire was advanced without resistance and a 6Fr sheath was placed.  Wire  and Kumpe catheter were then negotiated through the common iliac lesion on the left and subsequently through the aortic lesion. Another 2000 units of heparin was given.  An 8 French 45 sonometer [place] was then opened onto the field  this was attempted to be advanced over the wire however it would not track through the iliac stenosis and this was predilated with a 7 x 4 balloon to 12 atm for approximately 1 minute. The balloon was withdrawn sheath the dilator reintroduced but the sheath which still not track and therefore Kumpe catheter was advanced up into the aorta and the Amplatz wire was exchanged for the Magic torque wire. This still did not allow the sheath to track and therefore an 8 x 40 [place] balloon was advanced across the aortic stenosis antroplasty was performed to 12 atm and as the balloon was deflated and the balloon sheath combination were passed across the lesion positioning the tip of the sheath proximal to the aortic stenosis.  A Palma stent was attempted to be placed however this did not fit through the sheath also tried was a [place]ahn ultimately with an 8 French sheath in position a 14 x 40 Life Star stent was advanced across the lesion and opened without difficulty and was subsequently postdilated with a 10 x 40 balloon with excellent result.  Using a Kumpe catheter and a Rosen wire from the right sid  the wire was advanced through the stent and into the aorta Kumpe catheter was twirled in the stent insuring intraluminal positioning. Subsequently 2 7 x 29 Omnilink stents were deployed simultaneously reconstructing the aortic bifurcation and elevating the actual bifurcation by approximately 5 mm. Follow-up imaging demonstrated an excellent result however the stents were somewhat short and therefore 2 9 x 19 Omnilink stents were deployed 1 on the left 1 on the right this yielded a much better match distally to the poststenotic dilatation present in the distal common iliacs bilaterally. It also completely covered the common iliac lesions allowing for an improved angioplasty.  Contrast was then used to opacify the aorta and iliac arteries excellent result was noted without evidence of dissection the stents are completely  expanded and well approximated to the walls of the artery there does not appear to be any technical abnormality.  Oblique views of the groins were then obtained on the left there is patency of the distal external iliac and common femoral with moderate plaque distally suitable for Star close which was deployed without difficulty and achieving excellent hemostasis on the right in spite of my best attempts to access the common femoral above the plaque located within the common femoral the puncture appears to be involved with the more proximal portion and not suitable for closure device ACT was checked which was 245 with therefore waited 30 minutes and pulled with manual pressure.   Findings:   Aortogram:  The aorta is opacified with a bolus injection contrast. There are bilateral high-grade renal artery stenoses noted single renal arteries to each kidney nephrograms appear equal and normal in size. In the mid infrarenal aorta located at the level of the origin of the IMA there is a greater than 90% ringlike stenosis which is highly calcified. There is diffuse disease distally. Within the right and left common iliac arteries there are greater than 90% stenoses just distal to the origins extending to the midportion of the common iliacs bilaterally. The bilateral external iliac arteries are widely patent and appeared fairly smooth and free of hemodynamically significant lesions.  Right Lower Extremity:  The right common femoral demonstrates diffuse severe plaque formation with proximate 60-70% diameter reduction. The profunda and SFA are patent. They're both significantly diseased the profunda does not demonstrate any profound hemodynamically significant lesions. The the superficial femoral artery on the right is patent in its proximal two thirds however at Hunter's canal and extending into the popliteal there is diffuse disease with multiple greater than 60% lesions. Trifurcation is patent in all 3 tibial vessels  are identified however there is diffuse disease throughout the period  Left Lower Extremity: The right common femoral demonstrates diffuse severe plaque formation with proximate 40-50% diameter reduction. The profunda and SFA are patent. They're both significantly diseased the profunda does not demonstrate any profound hemodynamically significant lesions. The the superficial femoral artery on the left is patent in its proximal two thirds however at Hunter's canal and extending into the popliteal there is diffuse disease with multiple greater than 60% lesions. Trifurcation is patent in all 3 tibial vessels are identified however there is diffuse disease throughout the period  Following predilatation of the aortic stenosis to 8 mm and subsequently placement of a 14 x 40 life Star stent there is resolution of this lesion with less than 20% residual stenosis. Following placement of 7 x 29 Omnilink stents in both common iliacs and elevating the bifurcation of the aorta by proximally 5 mm and subsequent Lee extending these 2 stents with a 9 x 19 Omnilink stents there is now excellent treatment of the common iliac artery stenoses with good approximation of the stents to the arterial wall and less than 5% residual stenosis.    Disposition: Patient was taken to the recovery room in stable condition having tolerated the procedure well. there were no immediate complications  Arleigh Dicola, Dolores Lory 04/27/2015,10:44 AM

## 2015-04-27 NOTE — H&P (Signed)
[facility] History & Physical Update  The patient was interviewed and re-examined.  The patient's previous History and Physical has been reviewed and is unchanged.  There is no change in the plan of care.  Kriste Broman, Latina Craver, MD  04/27/2015, 11:04 AM

## 2015-05-03 ENCOUNTER — Encounter: Payer: Self-pay | Admitting: Internal Medicine

## 2015-05-03 ENCOUNTER — Ambulatory Visit (INDEPENDENT_AMBULATORY_CARE_PROVIDER_SITE_OTHER): Payer: Medicare Other | Admitting: Internal Medicine

## 2015-05-03 ENCOUNTER — Telehealth: Payer: Self-pay | Admitting: *Deleted

## 2015-05-03 VITALS — BP 122/60 | HR 83 | Temp 97.6°F | Resp 14 | Ht 60.0 in | Wt 130.8 lb

## 2015-05-03 DIAGNOSIS — R7301 Impaired fasting glucose: Secondary | ICD-10-CM

## 2015-05-03 DIAGNOSIS — M25512 Pain in left shoulder: Secondary | ICD-10-CM

## 2015-05-03 DIAGNOSIS — R0600 Dyspnea, unspecified: Secondary | ICD-10-CM

## 2015-05-03 DIAGNOSIS — E039 Hypothyroidism, unspecified: Secondary | ICD-10-CM

## 2015-05-03 DIAGNOSIS — Z7901 Long term (current) use of anticoagulants: Secondary | ICD-10-CM

## 2015-05-03 DIAGNOSIS — I701 Atherosclerosis of renal artery: Secondary | ICD-10-CM

## 2015-05-03 DIAGNOSIS — I10 Essential (primary) hypertension: Secondary | ICD-10-CM

## 2015-05-03 DIAGNOSIS — E785 Hyperlipidemia, unspecified: Secondary | ICD-10-CM | POA: Diagnosis not present

## 2015-05-03 DIAGNOSIS — E559 Vitamin D deficiency, unspecified: Secondary | ICD-10-CM

## 2015-05-03 DIAGNOSIS — I70209 Unspecified atherosclerosis of native arteries of extremities, unspecified extremity: Secondary | ICD-10-CM

## 2015-05-03 DIAGNOSIS — M7552 Bursitis of left shoulder: Secondary | ICD-10-CM | POA: Diagnosis not present

## 2015-05-03 DIAGNOSIS — R0609 Other forms of dyspnea: Secondary | ICD-10-CM

## 2015-05-03 MED ORDER — TRIAMCINOLONE ACETONIDE 40 MG/ML IJ SUSP
40.0000 mg | Freq: Once | INTRAMUSCULAR | Status: AC
  Administered 2015-05-03: 40 mg via INTRA_ARTICULAR

## 2015-05-03 NOTE — Patient Instructions (Addendum)
I am referring you to a pulmonologist to discuss the results of your pulmonary tests that were done in May  I injected your left shoulder today as requested  Please "baby " that shoulder for the next 48 to 72 hours.  Return next week for fasting labs

## 2015-05-03 NOTE — Progress Notes (Addendum)
Subjective:  Patient ID: Leah Preston, female    DOB: 1931/05/04  Age: 81 y.o. MRN: [id number]  CC: The primary encounter diagnosis was Dyspnea. Diagnoses of Bursitis of left shoulder, Hyperlipidemia, Impaired fasting glucose, Dyspnea on exertion, and Pain in joint, shoulder region, left were also pertinent to this visit.  HPI Leah Preston presents for  Follow up on PAD and COPD .  Had formal pulmonary function tests but no formal interpretation has been  Received.  She has a history of tobacco abuse spanning  25 years before quitting in 1996.    She recently had a normal stress test by Gwen Pounds  She was referred to Dr Gilda Crease for claudication symptoms and underwent angioplasty and placement of 5 stents.  In the infrarenal aorta and bilateral  iliac arteries,   Chronic pain in left shoulder,  Done last in January 2016 by an orthopedist in [facility].  Has pai nwith flexion abduction and adduction of shoulde.r  Taking up to 4 tyelnols daily    Steroid injection of the ft glenohumeral joint injecti onfor chronic caspulitis done today using Kenalog 40   1/2 ml  Outpatient Prescriptions Prior to Visit  Medication Sig Dispense Refill  . amitriptyline (ELAVIL) 50 MG tablet TAKE 1 TABLET (50 MG TOTAL) BY MOUTH DAILY. 30 tablet 2  . amLODipine (NORVASC) 10 MG tablet TAKE 1 TABLET DAILY 90 tablet 1  . Ascorbic Acid (VITAMIN C PO) Take by mouth daily.      . clopidogrel (PLAVIX) 75 MG tablet Take 75 mg by mouth daily.    . clorazepate (TRANXENE) 7.5 MG tablet Take 1 tablet (7.5 mg total) by mouth daily. 90 tablet 3  . esomeprazole (NEXIUM) 40 MG capsule TAKE 1 CAPSULE DAILY 90 capsule 1  . levETIRAcetam (KEPPRA) 1000 MG tablet Take 1 tablet (1,000 mg total) by mouth 2 (two) times daily. 180 tablet 3  . levothyroxine (SYNTHROID, LEVOTHROID) 50 MCG tablet TAKE 1 TABLET DAILY 90 tablet 1  . lisinopril (PRINIVIL,ZESTRIL) 20 MG tablet Take 1 Tablet by Mouth Daily 90 tablet 1  . meloxicam (MOBIC) 15 MG  tablet TAKE 1 TABLET DAILY 90 tablet 1  . metoprolol (LOPRESSOR) 50 MG tablet TAKE 1 TABLET TWICE A DAY 180 tablet 1  . Multiple Vitamins-Minerals (CENTRUM SILVER PO) Take by mouth daily.      Marland Kitchen atorvastatin (LIPITOR) 40 MG tablet TAKE 1 TABLET DAILY 90 tablet 1  . temazepam (RESTORIL) 7.5 MG capsule Take 1 capsule (7.5 mg total) by mouth at bedtime as needed for sleep. (Patient not taking: Reported on 05/03/2015) 30 capsule 2  . Choline Fenofibrate (FENOFIBRIC ACID) 135 MG CPDR TAKE 1 CAPSULE AT BEDTIME (Patient not taking: Reported on 05/03/2015) 90 capsule 1  . glucose blood test strip Use as instructed to check blood sugars once daily  250.00 (Patient not taking: Reported on 05/03/2015) 100 each 12  . levETIRAcetam (KEPPRA) 1000 MG tablet TAKE 1 TABLET TWICE A DAY 180 tablet 3   No facility-administered medications prior to visit.    Review of Systems;  Patient denies headache, fevers, malaise, unintentional weight loss, skin rash, eye pain, sinus congestion and sinus pain, sore throat, dysphagia,  hemoptysis , cough, dyspnea, wheezing, chest pain, palpitations, orthopnea, edema, abdominal pain, nausea, melena, diarrhea, constipation, flank pain, dysuria, hematuria, urinary  Frequency, nocturia, numbness, tingling, seizures,  Focal weakness, Loss of consciousness,  Tremor, insomnia, depression, anxiety, and suicidal ideation.      Objective:  BP 122/60 mmHg  Pulse 83  Temp(Src) 97.6 F (36.4 C) (Oral)  Resp 14  Ht 5' (1.524 m)  Wt 130 lb 12 oz (59.308 kg)  BMI 25.54 kg/m2  SpO2 97%  BP Readings from Last 3 Encounters:  05/03/15 122/60  04/27/15 142/78  02/23/15 140/72    Wt Readings from Last 3 Encounters:  05/03/15 130 lb 12 oz (59.308 kg)  04/27/15 128 lb (58.06 kg)  02/23/15 130 lb (58.968 kg)    General appearance: alert, cooperative and appears stated age Ears: normal TM's and external ear canals both ears Throat: lips, mucosa, and tongue normal; teeth and gums  normal Neck: no adenopathy, no carotid bruit, supple, symmetrical, trachea midline and thyroid not enlarged, symmetric, no tenderness/mass/nodules Back: symmetric, no curvature. ROM normal. No CVA tenderness. Lungs: clear to auscultation bilaterally Heart: regular rate and rhythm, S1, S2 normal, no murmur, click, rub or gallop Abdomen: soft, non-tender; bowel sounds normal; no masses,  no organomegaly Pulses: 2+ and symmetric Skin: Skin color, texture, turgor normal. No rashes or lesions Lymph nodes: Cervical, supraclavicular, and axillary nodes normal.  Lab Results  Component Value Date   HGBA1C 5.8 05/04/2015   HGBA1C 5.8 02/01/2015   HGBA1C 5.7 04/02/2014    Lab Results  Component Value Date   CREATININE 0.80 05/04/2015   CREATININE 1.16* 04/27/2015   CREATININE 0.91 02/23/2015    Lab Results  Component Value Date   WBC 10.0 05/04/2015   HGB 11.1* 05/04/2015   HCT 32.5* 05/04/2015   PLT 238.0 05/04/2015   GLUCOSE 122* 05/04/2015   CHOL 154 05/04/2015   TRIG 179.0* 05/04/2015   HDL 55.30 05/04/2015   LDLDIRECT 80.0 02/01/2015   LDLCALC 63 05/04/2015   ALT 18 05/04/2015   AST 14 05/04/2015   NA 138 05/04/2015   K 4.7 05/04/2015   CL 101 05/04/2015   CREATININE 0.80 05/04/2015   BUN 25* 05/04/2015   CO2 29 05/04/2015   TSH 1.21 05/04/2015   INR 0.97 02/23/2015   HGBA1C 5.8 05/04/2015   MICROALBUR 8.4* 02/01/2015    No results found.  Assessment & Plan:   Problem List Items Addressed This Visit      Unprioritized   Hyperlipidemia    She is at goal, which is LDL < 70 given history of PAD and CVD, but needs to be on high potency statin,  Will increase Lipitor to 80 mg daily .  Lab Results  Component Value Date   CHOL 154 05/04/2015   HDL 55.30 05/04/2015   LDLCALC 63 05/04/2015   LDLDIRECT 80.0 02/01/2015   TRIG 179.0* 05/04/2015   CHOLHDL 3 05/04/2015   Lab Results  Component Value Date   ALT 18 05/04/2015   AST 14 05/04/2015   ALKPHOS 77  05/04/2015   BILITOT 0.4 05/04/2015           Relevant Medications   aspirin 81 MG tablet   atorvastatin (LIPITOR) 80 MG tablet   Impaired fasting glucose    Lab Results  Component Value Date   HGBA1C 5.8 05/04/2015   Continue low gi diet.         Dyspnea on exertion    PFTS and pulmonology evaluation are in progress to determine if she has COPD vs restrictive lung disease.      Pain in joint, shoulder region    She has requested a steroid injection for relief of pain.  After receiving informed consent for procedure. she received a glenohumeral injection of Kenalog with no immediate  complications.        Other Visit Diagnoses    Dyspnea    -  Primary    Relevant Orders    Ambulatory referral to Pulmonology    Bursitis of left shoulder        Relevant Medications    triamcinolone acetonide (KENALOG-40) injection 40 mg (Completed)       I have discontinued Ms. Nerio's glucose blood and Fenofibric Acid. I have also changed her atorvastatin. Additionally, I am having her maintain her Multiple Vitamins-Minerals (CENTRUM SILVER PO), Ascorbic Acid (VITAMIN C PO), levETIRAcetam, levothyroxine, amitriptyline, esomeprazole, amLODipine, clopidogrel, meloxicam, temazepam, lisinopril, clorazepate, metoprolol, and aspirin. We administered triamcinolone acetonide.  Meds ordered this encounter  Medications  . aspirin 81 MG tablet    Sig: Take 81 mg by mouth daily.  Marland Kitchen triamcinolone acetonide (KENALOG-40) injection 40 mg    Sig:   . atorvastatin (LIPITOR) 80 MG tablet    Sig: Take 1 tablet (80 mg total) by mouth daily at 6 PM.    Dispense:  90 tablet    Refill:  1    Medications Discontinued During This Encounter  Medication Reason  . Choline Fenofibrate (FENOFIBRIC ACID) 135 MG CPDR Patient Preference  . glucose blood test strip Patient Preference  . levETIRAcetam (KEPPRA) 1000 MG tablet Duplicate  . atorvastatin (LIPITOR) 40 MG tablet Reorder    Follow-up: No Follow-up  on file.   Sherlene Shams, MD

## 2015-05-03 NOTE — Telephone Encounter (Signed)
Pt coming tomorrow labs and dx? 

## 2015-05-03 NOTE — Progress Notes (Signed)
Pre-visit discussion using our clinic review tool. No additional management support is needed unless otherwise documented below in the visit note.  

## 2015-05-04 ENCOUNTER — Other Ambulatory Visit (INDEPENDENT_AMBULATORY_CARE_PROVIDER_SITE_OTHER): Payer: Medicare Other

## 2015-05-04 DIAGNOSIS — M25519 Pain in unspecified shoulder: Secondary | ICD-10-CM | POA: Insufficient documentation

## 2015-05-04 DIAGNOSIS — Z7901 Long term (current) use of anticoagulants: Secondary | ICD-10-CM

## 2015-05-04 DIAGNOSIS — I70209 Unspecified atherosclerosis of native arteries of extremities, unspecified extremity: Secondary | ICD-10-CM

## 2015-05-04 DIAGNOSIS — E039 Hypothyroidism, unspecified: Secondary | ICD-10-CM

## 2015-05-04 DIAGNOSIS — R7301 Impaired fasting glucose: Secondary | ICD-10-CM | POA: Diagnosis not present

## 2015-05-04 DIAGNOSIS — E559 Vitamin D deficiency, unspecified: Secondary | ICD-10-CM | POA: Diagnosis not present

## 2015-05-04 LAB — COMPREHENSIVE METABOLIC PANEL
ALT: 18 U/L (ref 0–35)
AST: 14 U/L (ref 0–37)
Albumin: 3.9 g/dL (ref 3.5–5.2)
Alkaline Phosphatase: 77 U/L (ref 39–117)
BUN: 25 mg/dL — AB (ref 6–23)
CHLORIDE: 101 meq/L (ref 96–112)
CO2: 29 mEq/L (ref 19–32)
Calcium: 9.7 mg/dL (ref 8.4–10.5)
Creatinine, Ser: 0.8 mg/dL (ref 0.40–1.20)
GFR: 72.98 mL/min (ref >60.00)
Glucose, Bld: 122 mg/dL — ABNORMAL HIGH (ref 70–99)
Potassium: 4.7 mEq/L (ref 3.5–5.1)
SODIUM: 138 meq/L (ref 135–145)
Total Bilirubin: 0.4 mg/dL (ref 0.2–1.2)
Total Protein: 6.9 g/dL (ref 6.0–8.3)

## 2015-05-04 LAB — CBC WITH DIFFERENTIAL/PLATELET
BASOS ABS: 0 10*3/uL (ref 0.0–0.1)
Basophils Relative: 0.4 % (ref 0.0–3.0)
Eosinophils Absolute: 0.2 10*3/uL (ref 0.0–0.7)
Eosinophils Relative: 2.1 % (ref 0.0–5.0)
HCT: 32.5 % — ABNORMAL LOW (ref 36.0–46.0)
Hemoglobin: 11.1 g/dL — ABNORMAL LOW (ref 12.0–15.0)
Lymphocytes Relative: 28.6 % (ref 12.0–46.0)
Lymphs Abs: 2.9 10*3/uL (ref 0.7–4.0)
MCHC: 34.1 g/dL (ref 30.0–36.0)
MCV: 96 fl (ref 78.0–100.0)
MONO ABS: 0.6 10*3/uL (ref 0.1–1.0)
MONOS PCT: 5.9 % (ref 3.0–12.0)
NEUTROS ABS: 6.3 10*3/uL (ref 1.4–7.7)
Neutrophils Relative %: 63 % (ref 43.0–77.0)
PLATELETS: 238 10*3/uL (ref 150.0–400.0)
RBC: 3.38 Mil/uL — ABNORMAL LOW (ref 3.87–5.11)
RDW: 12.8 % (ref 11.5–15.5)
WBC: 10 10*3/uL (ref 4.0–10.5)

## 2015-05-04 LAB — TSH: TSH: 1.21 u[IU]/mL (ref 0.35–4.50)

## 2015-05-04 LAB — LIPID PANEL
CHOL/HDL RATIO: 3
Cholesterol: 154 mg/dL (ref 0–200)
HDL: 55.3 mg/dL (ref >39.00)
LDL Cholesterol: 63 mg/dL (ref 0–99)
NonHDL: 98.73
Triglycerides: 179 mg/dL — ABNORMAL HIGH (ref 0.0–149.0)
VLDL: 35.8 mg/dL (ref 0.0–40.0)

## 2015-05-04 LAB — HEMOGLOBIN A1C: HEMOGLOBIN A1C: 5.8 % (ref 4.6–6.5)

## 2015-05-04 LAB — VITAMIN D 25 HYDROXY (VIT D DEFICIENCY, FRACTURES): VITD: 33.01 ng/mL (ref 30.00–100.00)

## 2015-05-04 MED ORDER — ATORVASTATIN CALCIUM 80 MG PO TABS: 80.0000 mg | ORAL_TABLET | Freq: Every day | ORAL | Status: DC

## 2015-05-04 NOTE — Assessment & Plan Note (Signed)
Lab Results  Component Value Date   HGBA1C 5.8 05/04/2015   Continue low gi diet.

## 2015-05-04 NOTE — Assessment & Plan Note (Addendum)
She is at goal, which is LDL < 70 given history of PAD and CVD, but needs to be on high potency statin,  Will increase Lipitor to 80 mg daily .  Lab Results  Component Value Date   CHOL 154 05/04/2015   HDL 55.30 05/04/2015   LDLCALC 63 05/04/2015   LDLDIRECT 80.0 02/01/2015   TRIG 179.0* 05/04/2015   CHOLHDL 3 05/04/2015   Lab Results  Component Value Date   ALT 18 05/04/2015   AST 14 05/04/2015   ALKPHOS 77 05/04/2015   BILITOT 0.4 05/04/2015

## 2015-05-04 NOTE — Assessment & Plan Note (Signed)
She has requested a steroid injection for relief of pain.  After receiving informed consent for procedure. she received a glenohumeral injection of Kenalog with no immediate complications.

## 2015-05-04 NOTE — Assessment & Plan Note (Signed)
PFTS and pulmonology evaluation are in progress to determine if she has COPD vs restrictive lung disease.

## 2015-05-05 ENCOUNTER — Encounter: Payer: Self-pay | Admitting: *Deleted

## 2015-05-08 ENCOUNTER — Other Ambulatory Visit: Payer: Self-pay | Admitting: Internal Medicine

## 2015-05-10 ENCOUNTER — Encounter: Payer: Self-pay | Admitting: Internal Medicine

## 2015-05-10 ENCOUNTER — Ambulatory Visit (INDEPENDENT_AMBULATORY_CARE_PROVIDER_SITE_OTHER): Payer: Medicare Other | Admitting: Internal Medicine

## 2015-05-10 VITALS — BP 122/68 | HR 77 | Ht 60.0 in | Wt 130.0 lb

## 2015-05-10 DIAGNOSIS — R0602 Shortness of breath: Secondary | ICD-10-CM

## 2015-05-10 NOTE — Patient Instructions (Signed)

## 2015-05-10 NOTE — Assessment & Plan Note (Signed)
-  PFT's do not show any evidence of Obstructive Or restrictive Lung disease -SOB has resolved since she has had Vascular stents placed by Dr. Lorretta Harp.  -no indication for radiographic imaging at this time

## 2015-05-10 NOTE — Progress Notes (Signed)
Date: 05/10/2015,   MRN# [id number] Leah Preston May 29, 1931 Code Status:  Hosp day:@LENGTHOFSTAYDAYS @ Referring MD: @ATDPROV @     PCP:      AdmissionWeight: 130 lb (58.968 kg)                 CurrentWeight: 130 lb (58.968 kg) Leah Preston is a 81 y.o. old female seen in consultation for SOB. CHIEF COMPLAINT:  SOB for several months    HISTORY OF PRESENT ILLNESS   81 yo white female seen today for symptoms of SOB which has resolved 2 weeks ago ever since she had her vascular stents placed by Vascular Surgery.  Patient has states that she feels well and she has no acute complaints.  Patient has no signs or symptoms of infection, Patient states that her feet were bothering her for awhile and that has contributed to her overall well being and that affected her breathing.  During the work up for her vascular disease, patient had pFT's. I have explained to patient that her PFT analysis looks good, no acute issues noted     PAST MEDICAL HISTORY   Past Medical History  Diagnosis Date  . Peripheral vascular disease     s/p carotid endarterectomy  . History of bladder problems   . Hypertension   . Hyperlipidemia   . OA (osteoarthritis of spine)     with chronic pain  . coronary artery disease   . subdurah/subarachnoid hemorrhage jun 2012    left parietal, sent to [facility]     SURGICAL HISTORY   Past Surgical History  Procedure Laterality Date  . Cesarean section    . Carotid endarterectomy  jan 2012    left, complicated by large hematoma, evacuated  . Fracture surgery  2006    Left radial pinning  . Joint replacement      Left TKR 2010, [facility]  . Bladder suspension  2003    [facility]  . Cataract extraction, bilateral  August 2013  . Abdominal hysterectomy  1968    menorrhagia  . Peripheral vascular catheterization Left 04/27/2015    Procedure: Lower Extremity Angiography;  Surgeon: Renford Dills, MD;  Location: [facility] INVASIVE CV LAB;  Service: Cardiovascular;  Laterality:  Left;  . Peripheral vascular catheterization Left 04/27/2015    Procedure: Lower Extremity Intervention;  Surgeon: Renford Dills, MD;  Location: [facility] INVASIVE CV LAB;  Service: Cardiovascular;  Laterality: Left;     FAMILY HISTORY   Family History  Problem Relation Age of Onset  . Stroke Mother      SOCIAL HISTORY   Social History  Substance Use Topics  . Smoking status: Former Smoker    Quit date: 06/07/1995  . Smokeless tobacco: Never Used  . Alcohol Use: No     MEDICATIONS    Home Medication:  Current Outpatient Rx  Name  Route  Sig  Dispense  Refill  . amitriptyline (ELAVIL) 50 MG tablet      TAKE 1 TABLET (50 MG TOTAL) BY MOUTH DAILY.   30 tablet   2   . amLODipine (NORVASC) 10 MG tablet      TAKE 1 TABLET DAILY   90 tablet   1   . Ascorbic Acid (VITAMIN C PO)   Oral   Take by mouth daily.           Marland Kitchen aspirin 81 MG tablet   Oral   Take 81 mg by mouth daily.         Marland Kitchen  atorvastatin (LIPITOR) 80 MG tablet   Oral   Take 1 tablet (80 mg total) by mouth daily at 6 PM.   90 tablet   1   . clopidogrel (PLAVIX) 75 MG tablet   Oral   Take 75 mg by mouth daily.         . clorazepate (TRANXENE) 7.5 MG tablet   Oral   Take 1 tablet (7.5 mg total) by mouth daily.   90 tablet   3   . esomeprazole (NEXIUM) 40 MG capsule      TAKE 1 CAPSULE DAILY   90 capsule   1   . levETIRAcetam (KEPPRA) 1000 MG tablet   Oral   Take 1 tablet (1,000 mg total) by mouth 2 (two) times daily. Patient taking differently: Take 500 mg by mouth daily.    180 tablet   3   . levothyroxine (SYNTHROID, LEVOTHROID) 50 MCG tablet      TAKE 1 TABLET DAILY   90 tablet   1   . lisinopril (PRINIVIL,ZESTRIL) 20 MG tablet      Take 1 Tablet by Mouth Daily   90 tablet   1   . meloxicam (MOBIC) 15 MG tablet      TAKE 1 TABLET DAILY   90 tablet   1   . metoprolol (LOPRESSOR) 50 MG tablet      TAKE 1 TABLET TWICE A DAY   180 tablet   1   . Multiple  Vitamins-Minerals (CENTRUM SILVER PO)   Oral   Take by mouth daily.           . temazepam (RESTORIL) 7.5 MG capsule   Oral   Take 1 capsule (7.5 mg total) by mouth at bedtime as needed for sleep.   30 capsule   2     Current Medication:  Current outpatient prescriptions:  .  amitriptyline (ELAVIL) 50 MG tablet, TAKE 1 TABLET (50 MG TOTAL) BY MOUTH DAILY., Disp: 30 tablet, Rfl: 2 .  amLODipine (NORVASC) 10 MG tablet, TAKE 1 TABLET DAILY, Disp: 90 tablet, Rfl: 1 .  Ascorbic Acid (VITAMIN C PO), Take by mouth daily.  , Disp: , Rfl:  .  aspirin 81 MG tablet, Take 81 mg by mouth daily., Disp: , Rfl:  .  atorvastatin (LIPITOR) 80 MG tablet, Take 1 tablet (80 mg total) by mouth daily at 6 PM., Disp: 90 tablet, Rfl: 1 .  clopidogrel (PLAVIX) 75 MG tablet, Take 75 mg by mouth daily., Disp: , Rfl:  .  clorazepate (TRANXENE) 7.5 MG tablet, Take 1 tablet (7.5 mg total) by mouth daily., Disp: 90 tablet, Rfl: 3 .  esomeprazole (NEXIUM) 40 MG capsule, TAKE 1 CAPSULE DAILY, Disp: 90 capsule, Rfl: 1 .  levETIRAcetam (KEPPRA) 1000 MG tablet, Take 1 tablet (1,000 mg total) by mouth 2 (two) times daily. (Patient taking differently: Take 500 mg by mouth daily. ), Disp: 180 tablet, Rfl: 3 .  levothyroxine (SYNTHROID, LEVOTHROID) 50 MCG tablet, TAKE 1 TABLET DAILY, Disp: 90 tablet, Rfl: 1 .  lisinopril (PRINIVIL,ZESTRIL) 20 MG tablet, Take 1 Tablet by Mouth Daily, Disp: 90 tablet, Rfl: 1 .  meloxicam (MOBIC) 15 MG tablet, TAKE 1 TABLET DAILY, Disp: 90 tablet, Rfl: 1 .  metoprolol (LOPRESSOR) 50 MG tablet, TAKE 1 TABLET TWICE A DAY, Disp: 180 tablet, Rfl: 1 .  Multiple Vitamins-Minerals (CENTRUM SILVER PO), Take by mouth daily.  , Disp: , Rfl:  .  temazepam (RESTORIL) 7.5 MG capsule, Take 1 capsule (7.5  mg total) by mouth at bedtime as needed for sleep., Disp: 30 capsule, Rfl: 2    ALLERGIES   Oxycodone hcl and Tetanus toxoids     REVIEW OF SYSTEMS   Review of Systems  Constitutional: Negative  for fever, chills, weight loss, malaise/fatigue and diaphoresis.  HENT: Negative for congestion and hearing loss.   Eyes: Negative for blurred vision and double vision.  Respiratory: Negative for cough, hemoptysis, sputum production, shortness of breath and wheezing.   Cardiovascular: Negative for chest pain, palpitations and orthopnea.  Gastrointestinal: Negative for heartburn, nausea, vomiting, abdominal pain, diarrhea, constipation and blood in stool.  Genitourinary: Negative for dysuria and urgency.  Musculoskeletal: Negative for myalgias, back pain and neck pain.  Skin: Negative for rash.  Neurological: Negative for dizziness, tingling, tremors, weakness and headaches.  Endo/Heme/Allergies: Does not bruise/bleed easily.  Psychiatric/Behavioral: Negative for hallucinations. The patient is not nervous/anxious.   All other systems reviewed and are negative.    VS: BP 122/68 mmHg  Pulse 77  Ht 5' (1.524 m)  Wt 130 lb (58.968 kg)  BMI 25.39 kg/m2  SpO2 97%     PHYSICAL EXAM  Physical Exam  Constitutional: She is oriented to person, place, and time. She appears well-developed and well-nourished. No distress.  HENT:  Head: Normocephalic and atraumatic.  Mouth/Throat: No oropharyngeal exudate.  Eyes: EOM are normal. Pupils are equal, round, and reactive to light. No scleral icterus.  Neck: Normal range of motion. Neck supple.  Cardiovascular: Normal rate, regular rhythm and normal heart sounds.   No murmur heard. Pulmonary/Chest: No stridor. No respiratory distress. She has no wheezes. She has no rales.  Abdominal: Soft. Bowel sounds are normal. She exhibits no distension. There is no tenderness. There is no rebound.  Musculoskeletal: Normal range of motion. She exhibits no edema.  Neurological: She is alert and oriented to person, place, and time. She displays normal reflexes. Coordination normal.  Skin: Skin is warm. She is not diaphoretic.  Psychiatric: She has a normal mood  and affect.        LABS    No results for input(s): HGB, HCT, MCV, WBC, POTASSIUM, CHLORIDE, BUN, CREATININE, GLUCOSE, CALCIUM, INR, PTT in the last 72 hours.  Invalid input(s): PLATELET, BANDS, NEUTROPHIL, LYMPHOCYTE, MONOCYTE, EOSINOPHILS, BASOPHIL, SODIUM, BICARBONATE, MAGNESIUM, PHOSPHORUS, PT, SGPT, SGOT,    No results for input(s): PH in the last 72 hours.  Invalid input(s): PCO2, PO2, BASEEXCESS, BASEDEFICITE, TFT    CULTURE RESULTS   No results found for this or any previous visit (from the past 240 hour(s)).           ASSESSMENT/PLAN   81 yo white female with previous h/o SOB which has resolved since her vascular procedure     SOB (shortness of breath) -PFT's do not show any evidence of Obstructive Or restrictive Lung disease -SOB has resolved since she has had Vascular stents placed by Dr. Lorretta Harp.  -no indication for radiographic imaging at this time    Advised patient to call with any questions.   I have personally obtained a history, examined the patient, evaluated laboratory and independently reviewed imaging results, formulated the assessment and plan and placed orders.  The Patient requires high complexity decision making for assessment and support, frequent evaluation and titration of therapies, application of advanced monitoring technologies and extensive interpretation of multiple databases. Time spent with patient  35 minutes.  Patient is satisfied with Plan of action and management.    Lucie Leather, M.D.  [facility]  P[facility] & Critical Care Medicine  Medical Director [place] Director [facility] Cardio-Pulmonary Department

## 2015-06-15 ENCOUNTER — Other Ambulatory Visit: Payer: Self-pay

## 2015-06-15 MED ORDER — AMLODIPINE BESYLATE 10 MG PO TABS: 10.0000 mg | ORAL_TABLET | Freq: Every day | ORAL | Status: AC

## 2015-06-15 MED ORDER — ATORVASTATIN CALCIUM 80 MG PO TABS: 80.0000 mg | ORAL_TABLET | Freq: Every day | ORAL | Status: AC

## 2015-06-15 NOTE — Telephone Encounter (Signed)
Please advise? Medication was refilled on 04/19/15 for 90 day supply and 3 refills.

## 2015-06-21 ENCOUNTER — Telehealth: Payer: Self-pay | Admitting: Internal Medicine

## 2015-06-21 NOTE — Telephone Encounter (Signed)
Please advise I do not see where you ordered labs at [facility].

## 2015-06-21 NOTE — Telephone Encounter (Signed)
I did not order any labs from 5/31.. I will need more info

## 2015-06-21 NOTE — Telephone Encounter (Signed)
[name] [phone] [facility] stating they need an lab order from 02/16/15 for their billing department. Thank You!

## 2015-06-22 NOTE — Telephone Encounter (Signed)
Ok thanks 

## 2015-06-22 NOTE — Telephone Encounter (Signed)
Talked with Leah Preston was an order for PFT  I have printed and need your signature for Billing? In quicksign.

## 2015-06-23 ENCOUNTER — Ambulatory Visit (INDEPENDENT_AMBULATORY_CARE_PROVIDER_SITE_OTHER): Payer: Medicare Other | Admitting: Internal Medicine

## 2015-06-23 ENCOUNTER — Encounter: Payer: Self-pay | Admitting: Internal Medicine

## 2015-06-23 VITALS — BP 144/62 | HR 78 | Temp 98.6°F | Resp 12 | Ht 60.0 in | Wt 130.2 lb

## 2015-06-23 DIAGNOSIS — D649 Anemia, unspecified: Secondary | ICD-10-CM

## 2015-06-23 DIAGNOSIS — I10 Essential (primary) hypertension: Secondary | ICD-10-CM

## 2015-06-23 DIAGNOSIS — Z23 Encounter for immunization: Secondary | ICD-10-CM | POA: Diagnosis not present

## 2015-06-23 DIAGNOSIS — I70209 Unspecified atherosclerosis of native arteries of extremities, unspecified extremity: Secondary | ICD-10-CM | POA: Diagnosis not present

## 2015-06-23 DIAGNOSIS — Z8679 Personal history of other diseases of the circulatory system: Secondary | ICD-10-CM

## 2015-06-23 DIAGNOSIS — R7301 Impaired fasting glucose: Secondary | ICD-10-CM

## 2015-06-23 DIAGNOSIS — E785 Hyperlipidemia, unspecified: Secondary | ICD-10-CM

## 2015-06-23 DIAGNOSIS — G4700 Insomnia, unspecified: Secondary | ICD-10-CM

## 2015-06-23 LAB — IRON AND TIBC
%SAT: 23 % (ref 11–50)
Iron: 89 ug/dL (ref 45–160)
TIBC: 379 ug/dL (ref 250–450)
UIBC: 290 ug/dL (ref 125–400)

## 2015-06-23 MED ORDER — LISINOPRIL 20 MG PO TABS: 20.0000 mg | ORAL_TABLET | Freq: Two times a day (BID) | ORAL | Status: DC

## 2015-06-23 NOTE — Patient Instructions (Addendum)
Your blood pressure is still elevated.   I want you to Increase your lisinopril to morning and evening for blood pressure   You should not stop your plavix for a year from the date of your stents to keep them from closing up  You received your last pneumonia vaccine and your annual hi dose flu vaccine today

## 2015-06-23 NOTE — Progress Notes (Signed)
Pre-visit discussion using our clinic review tool. No additional management support is needed unless otherwise documented below in the visit note.  

## 2015-06-23 NOTE — Progress Notes (Signed)
Subjective:  Patient ID: Leah Preston, female    DOB: 31-Dec-1930  Age: 81 y.o. MRN: [id number]  CC: The primary encounter diagnosis was Anemia, unspecified anemia type. Diagnoses of Encounter for immunization, Need for prophylactic vaccination against Streptococcus pneumoniae (pneumococcus), Atherosclerotic peripheral vascular disease (HCC), Essential hypertension, History of spontaneous subarachnoid intracranial hemorrhage, Hyperlipidemia, Impaired fasting glucose, and Insomnia were also pertinent to this visit.  HPI Leah Preston presents for  Follow up on hypertension ,  Hypothyroidism , PAD, and dyspnea.  After her last visit she was referred to Pulmonology. PFTS were normal despite 25 years of tobacco abuse and pulmonology evaluation found no issues. On prior vascular evaluation she was found to have severe PAD with critical stenoses of the infrarenal  aorta and both common  iliac arteries  and underwent placement of 3 stents by Dr Gilda Crease on August 9.   She reports that her dyspnea has improved with vascular interventions and she is tolerating atorvastatin.  And plavix. She is taking Plavix and wants to know if she can suspend it to have surgery to correct right eyelid ptosis.  Recommendations to continue daily Plavix for minimum of one year without suspension was advised today   Confused about her medications .  Has seen a urologist Dr Doran Durand while vacationing in [place] for recurrent UTI and was prescriibed keflex 250 mg qhs.   Chronic insomnia:  Has multiple sedating mediations  Listed on med list that she has used to manage insomnia.  States that she is currently using clorazepate and amitriptyline to help sleep.  Not using restoril .  Advised to stop using amitriptyline  Her [place] internist/neurologist has reduced Keppra to 500 mg qhs  Outpatient Prescriptions Prior to Visit  Medication Sig Dispense Refill  . amLODipine (NORVASC) 10 MG tablet Take 1 tablet (10 mg total) by mouth daily.  90 tablet 3  . Ascorbic Acid (VITAMIN C PO) Take by mouth daily.      Marland Kitchen atorvastatin (LIPITOR) 80 MG tablet Take 1 tablet (80 mg total) by mouth daily at 6 PM. 90 tablet 3  . clopidogrel (PLAVIX) 75 MG tablet Take 75 mg by mouth daily.    . clorazepate (TRANXENE) 7.5 MG tablet Take 1 tablet (7.5 mg total) by mouth daily. 90 tablet 3  . esomeprazole (NEXIUM) 40 MG capsule TAKE 1 CAPSULE DAILY 90 capsule 1  . levothyroxine (SYNTHROID, LEVOTHROID) 50 MCG tablet TAKE 1 TABLET DAILY 90 tablet 1  . metoprolol (LOPRESSOR) 50 MG tablet TAKE 1 TABLET TWICE A DAY 180 tablet 1  . Multiple Vitamins-Minerals (CENTRUM SILVER PO) Take by mouth daily.      Marland Kitchen amitriptyline (ELAVIL) 50 MG tablet TAKE 1 TABLET (50 MG TOTAL) BY MOUTH DAILY. 30 tablet 2  . lisinopril (PRINIVIL,ZESTRIL) 20 MG tablet Take 1 Tablet by Mouth Daily 90 tablet 1  . temazepam (RESTORIL) 7.5 MG capsule Take 1 capsule (7.5 mg total) by mouth at bedtime as needed for sleep. 30 capsule 2  . aspirin 81 MG tablet Take 81 mg by mouth daily.    Marland Kitchen levETIRAcetam (KEPPRA) 1000 MG tablet Take 1 tablet (1,000 mg total) by mouth 2 (two) times daily. (Patient taking differently: Take 500 mg by mouth daily. ) 180 tablet 3  . meloxicam (MOBIC) 15 MG tablet TAKE 1 TABLET DAILY (Patient not taking: Reported on 06/23/2015) 90 tablet 1   No facility-administered medications prior to visit.    Review of Systems;  Patient denies headache, fevers, malaise, unintentional  weight loss, skin rash, eye pain, sinus congestion and sinus pain, sore throat, dysphagia,  hemoptysis , cough, dyspnea, wheezing, chest pain, palpitations, orthopnea, edema, abdominal pain, nausea, melena, diarrhea, constipation, flank pain, dysuria, hematuria, urinary  Frequency, nocturia, numbness, tingling, seizures,  Focal weakness, Loss of consciousness,  Tremor, insomnia, depression, anxiety, and suicidal ideation.      Objective:  BP 144/62 mmHg  Pulse 78  Temp(Src) 98.6 F (37 C)  (Oral)  Resp 12  Ht 5' (1.524 m)  Wt 130 lb 4 oz (59.081 kg)  BMI 25.44 kg/m2  SpO2 95%  BP Readings from Last 3 Encounters:  06/23/15 144/62  05/10/15 122/68  05/03/15 122/60    Wt Readings from Last 3 Encounters:  06/23/15 130 lb 4 oz (59.081 kg)  05/10/15 130 lb (58.968 kg)  05/03/15 130 lb 12 oz (59.308 kg)    General appearance: alert, cooperative and appears stated age Neck: no adenopathy, no carotid bruit, supple, symmetrical, trachea midline and thyroid not enlarged, symmetric, no tenderness/mass/nodules Back: symmetric, no curvature. ROM normal. No CVA tenderness. Lungs: clear to auscultation bilaterally Heart: regular rate and rhythm, S1, S2 normal, no murmur, click, rub or gallop Abdomen: soft, non-tender; bowel sounds normal; no masses,  no organomegaly Pulses: 2+ and symmetric, feet warm and perfused.  Skin: Skin color, texture, turgor normal. No rashes or lesions Lymph nodes: Cervical, supraclavicular, and axillary nodes normal.  Lab Results  Component Value Date   HGBA1C 5.8 05/04/2015   HGBA1C 5.8 02/01/2015   HGBA1C 5.7 04/02/2014    Lab Results  Component Value Date   CREATININE 0.80 05/04/2015   CREATININE 1.16* 04/27/2015   CREATININE 0.91 02/23/2015    Lab Results  Component Value Date   WBC 11.1* 06/23/2015   HGB 12.3 06/23/2015   HCT 36.3 06/23/2015   PLT 196.0 06/23/2015   GLUCOSE 122* 05/04/2015   CHOL 154 05/04/2015   TRIG 179.0* 05/04/2015   HDL 55.30 05/04/2015   LDLDIRECT 80.0 02/01/2015   LDLCALC 63 05/04/2015   ALT 18 05/04/2015   AST 14 05/04/2015   NA 138 05/04/2015   K 4.7 05/04/2015   CL 101 05/04/2015   CREATININE 0.80 05/04/2015   BUN 25* 05/04/2015   CO2 29 05/04/2015   TSH 1.21 05/04/2015   INR 0.97 02/23/2015   HGBA1C 5.8 05/04/2015   MICROALBUR 8.4* 02/01/2015    No results found.  Assessment & Plan:   Problem List Items Addressed This Visit    Hyperlipidemia    She is at goal, which is LDL < 70 given  history of PAD and CVD, and statin was increased to Lipitor  80 mg daily .  Lab Results  Component Value Date   CHOL 154 05/04/2015   HDL 55.30 05/04/2015   LDLCALC 63 05/04/2015   LDLDIRECT 80.0 02/01/2015   TRIG 179.0* 05/04/2015   CHOLHDL 3 05/04/2015   Lab Results  Component Value Date   ALT 18 05/04/2015   AST 14 05/04/2015   ALKPHOS 77 05/04/2015   BILITOT 0.4 05/04/2015             Relevant Medications   lisinopril (PRINIVIL,ZESTRIL) 20 MG tablet   Essential hypertension    Medications reviewed and adjusted. Lisinopril increased to 20 mg bid.   Lab Results  Component Value Date   NA 138 05/04/2015   K 4.7 05/04/2015   CL 101 05/04/2015   CO2 29 05/04/2015   Lab Results  Component Value Date   CREATININE  0.80 05/04/2015           Relevant Medications   lisinopril (PRINIVIL,ZESTRIL) 20 MG tablet   History of spontaneous subarachnoid intracranial hemorrhage    With residual dysarthria,  Continue keppra , dose reduced recently.       Atherosclerotic peripheral vascular disease (HCC)    Secondary to history of 25 years of tobacco abuse .  She is now s/p PTCA/stent of inrarenal aorta, bilateral common iliac arteries done august 9th,  Her rest pain has resolved as has her dyspea,  Continue Plavix,  Asa, and Lipitor.       Relevant Medications   lisinopril (PRINIVIL,ZESTRIL) 20 MG tablet   Impaired fasting glucose    Lab Results  Component Value Date   HGBA1C 5.8 05/04/2015   A1c is pre diabetic .  Advised to Continue low gi diet.           Insomnia    Trial of Belsomra and  restoril 7.5 mg failed per patient.  Advised to take only clorazepate and stop elavil          Other Visit Diagnoses    Anemia, unspecified anemia type    -  Primary    Relevant Orders    Folate RBC (Completed)    Iron and TIBC (Completed)    Vitamin B12 (Completed)    Ferritin (Completed)    CBC with Differential/Platelet (Completed)    Encounter for immunization         Need for prophylactic vaccination against Streptococcus pneumoniae (pneumococcus)        Relevant Orders    Pneumococcal polysaccharide vaccine 23-valent greater than or equal to 2yo subcutaneous/IM (Completed)     A total of 40 minutes was spent with patient more than half of which was spent in counseling patient on the above mentioned issues , reviewing and explaining recent labs and imaging studies done, and coordination of care.  I have discontinued Ms. Culley's amitriptyline, meloxicam, and temazepam. I have also changed her lisinopril. Additionally, I am having her maintain her Multiple Vitamins-Minerals (CENTRUM SILVER PO), Ascorbic Acid (VITAMIN C PO), levothyroxine, clopidogrel, clorazepate, metoprolol, aspirin, esomeprazole, amLODipine, atorvastatin, and levETIRAcetam.  Meds ordered this encounter  Medications  . levETIRAcetam (KEPPRA) 500 MG tablet    Sig: Take 500 mg by mouth daily.  Marland Kitchen lisinopril (PRINIVIL,ZESTRIL) 20 MG tablet    Sig: Take 1 tablet (20 mg total) by mouth 2 (two) times daily.    Dispense:  180 tablet    Refill:  1    Medications Discontinued During This Encounter  Medication Reason  . levETIRAcetam (KEPPRA) 1000 MG tablet Change in therapy  . lisinopril (PRINIVIL,ZESTRIL) 20 MG tablet Reorder  . temazepam (RESTORIL) 7.5 MG capsule   . amitriptyline (ELAVIL) 50 MG tablet   . meloxicam (MOBIC) 15 MG tablet     Follow-up: No Follow-up on file.   Sherlene Shams, MD

## 2015-06-24 LAB — CBC WITH DIFFERENTIAL/PLATELET
BASOS ABS: 0 10*3/uL (ref 0.0–0.1)
Basophils Relative: 0.3 % (ref 0.0–3.0)
Eosinophils Absolute: 0.3 10*3/uL (ref 0.0–0.7)
Eosinophils Relative: 2.3 % (ref 0.0–5.0)
HCT: 36.3 % (ref 36.0–46.0)
Hemoglobin: 12.3 g/dL (ref 12.0–15.0)
Lymphocytes Relative: 28.4 % (ref 12.0–46.0)
Lymphs Abs: 3.2 10*3/uL (ref 0.7–4.0)
MCHC: 33.9 g/dL (ref 30.0–36.0)
MCV: 97.9 fl (ref 78.0–100.0)
MONO ABS: 0.7 10*3/uL (ref 0.1–1.0)
Monocytes Relative: 6.1 % (ref 3.0–12.0)
NEUTROS PCT: 62.9 % (ref 43.0–77.0)
Neutro Abs: 7 10*3/uL (ref 1.4–7.7)
Platelets: 196 10*3/uL (ref 150.0–400.0)
RBC: 3.71 Mil/uL — AB (ref 3.87–5.11)
RDW: 13.6 % (ref 11.5–15.5)
WBC: 11.1 10*3/uL — ABNORMAL HIGH (ref 4.0–10.5)

## 2015-06-24 LAB — FERRITIN: Ferritin: 35.4 ng/mL (ref 10.0–291.0)

## 2015-06-24 LAB — FOLATE RBC: RBC Folate: >1000 ng/mL (ref >280)

## 2015-06-24 LAB — VITAMIN B12: Vitamin B-12: 447 pg/mL (ref 211–911)

## 2015-06-25 ENCOUNTER — Encounter: Payer: Self-pay | Admitting: *Deleted

## 2015-06-25 ENCOUNTER — Encounter: Payer: Self-pay | Admitting: Internal Medicine

## 2015-06-25 NOTE — Assessment & Plan Note (Signed)
Trial of Belsomra and  restoril 7.5 mg failed per patient.  Advised to take only clorazepate and stop elavil

## 2015-06-25 NOTE — Assessment & Plan Note (Signed)
She is at goal, which is LDL < 70 given history of PAD and CVD, and statin was increased to Lipitor  80 mg daily .  Lab Results  Component Value Date   CHOL 154 05/04/2015   HDL 55.30 05/04/2015   LDLCALC 63 05/04/2015   LDLDIRECT 80.0 02/01/2015   TRIG 179.0* 05/04/2015   CHOLHDL 3 05/04/2015   Lab Results  Component Value Date   ALT 18 05/04/2015   AST 14 05/04/2015   ALKPHOS 77 05/04/2015   BILITOT 0.4 05/04/2015

## 2015-06-25 NOTE — Assessment & Plan Note (Addendum)
Medications reviewed and adjusted. Lisinopril increased to 20 mg bid.   Lab Results  Component Value Date   NA 138 05/04/2015   K 4.7 05/04/2015   CL 101 05/04/2015   CO2 29 05/04/2015   Lab Results  Component Value Date   CREATININE 0.80 05/04/2015

## 2015-06-25 NOTE — Assessment & Plan Note (Signed)
Lab Results  Component Value Date   HGBA1C 5.8 05/04/2015   A1c is pre diabetic .  Advised to Continue low gi diet.

## 2015-06-25 NOTE — Assessment & Plan Note (Signed)
Secondary to history of 25 years of tobacco abuse .  She is now s/p PTCA/stent of inrarenal aorta, bilateral common iliac arteries done august 9th,  Her rest pain has resolved as has her dyspea,  Continue Plavix,  Asa, and Lipitor.

## 2015-06-25 NOTE — Assessment & Plan Note (Signed)
With residual dysarthria,  Continue keppra , dose reduced recently.

## 2015-06-30 ENCOUNTER — Telehealth: Payer: Self-pay | Admitting: *Deleted

## 2015-06-30 NOTE — Telephone Encounter (Signed)
[facility] has requested a order for a pulmonary function test from 02/16/15  Fax number [phone]  Attn:Stacey

## 2015-06-30 NOTE — Telephone Encounter (Signed)
Order printed and faxed.

## 2015-08-24 ENCOUNTER — Other Ambulatory Visit: Payer: Self-pay | Admitting: Internal Medicine

## 2015-10-11 ENCOUNTER — Other Ambulatory Visit: Payer: Self-pay

## 2015-10-11 ENCOUNTER — Other Ambulatory Visit: Payer: Self-pay | Admitting: Internal Medicine

## 2015-10-11 MED ORDER — LEVOTHYROXINE SODIUM 50 MCG PO TABS: ORAL_TABLET | ORAL | Status: DC

## 2015-10-20 ENCOUNTER — Other Ambulatory Visit: Payer: Self-pay | Admitting: Internal Medicine

## 2015-11-02 ENCOUNTER — Other Ambulatory Visit: Payer: Self-pay | Admitting: Internal Medicine

## 2015-11-30 ENCOUNTER — Other Ambulatory Visit: Payer: Self-pay | Admitting: Internal Medicine

## 2016-01-06 ENCOUNTER — Emergency Department: Payer: Medicare Other

## 2016-01-06 ENCOUNTER — Observation Stay: Payer: Medicare Other

## 2016-01-06 ENCOUNTER — Encounter: Payer: Self-pay | Admitting: Emergency Medicine

## 2016-01-06 ENCOUNTER — Inpatient Hospital Stay
Admission: EM | Admit: 2016-01-06 | Discharge: 2016-01-17 | DRG: 064 | Disposition: E | Payer: Medicare Other | Attending: Internal Medicine | Admitting: Internal Medicine

## 2016-01-06 DIAGNOSIS — Z9841 Cataract extraction status, right eye: Secondary | ICD-10-CM

## 2016-01-06 DIAGNOSIS — I639 Cerebral infarction, unspecified: Principal | ICD-10-CM | POA: Diagnosis present

## 2016-01-06 DIAGNOSIS — Z9889 Other specified postprocedural states: Secondary | ICD-10-CM

## 2016-01-06 DIAGNOSIS — Z9842 Cataract extraction status, left eye: Secondary | ICD-10-CM

## 2016-01-06 DIAGNOSIS — M25559 Pain in unspecified hip: Secondary | ICD-10-CM

## 2016-01-06 DIAGNOSIS — Z90710 Acquired absence of both cervix and uterus: Secondary | ICD-10-CM

## 2016-01-06 DIAGNOSIS — I739 Peripheral vascular disease, unspecified: Secondary | ICD-10-CM | POA: Diagnosis present

## 2016-01-06 DIAGNOSIS — Z7982 Long term (current) use of aspirin: Secondary | ICD-10-CM

## 2016-01-06 DIAGNOSIS — R52 Pain, unspecified: Secondary | ICD-10-CM

## 2016-01-06 DIAGNOSIS — Z452 Encounter for adjustment and management of vascular access device: Secondary | ICD-10-CM | POA: Insufficient documentation

## 2016-01-06 DIAGNOSIS — Z87891 Personal history of nicotine dependence: Secondary | ICD-10-CM

## 2016-01-06 DIAGNOSIS — R0602 Shortness of breath: Secondary | ICD-10-CM | POA: Insufficient documentation

## 2016-01-06 DIAGNOSIS — Z79899 Other long term (current) drug therapy: Secondary | ICD-10-CM

## 2016-01-06 DIAGNOSIS — Z955 Presence of coronary angioplasty implant and graft: Secondary | ICD-10-CM

## 2016-01-06 DIAGNOSIS — I469 Cardiac arrest, cause unspecified: Secondary | ICD-10-CM | POA: Diagnosis not present

## 2016-01-06 DIAGNOSIS — S22039A Unspecified fracture of third thoracic vertebra, initial encounter for closed fracture: Secondary | ICD-10-CM | POA: Diagnosis present

## 2016-01-06 DIAGNOSIS — Z823 Family history of stroke: Secondary | ICD-10-CM

## 2016-01-06 DIAGNOSIS — I10 Essential (primary) hypertension: Secondary | ICD-10-CM | POA: Diagnosis present

## 2016-01-06 DIAGNOSIS — J9690 Respiratory failure, unspecified, unspecified whether with hypoxia or hypercapnia: Secondary | ICD-10-CM | POA: Diagnosis present

## 2016-01-06 DIAGNOSIS — I2510 Atherosclerotic heart disease of native coronary artery without angina pectoris: Secondary | ICD-10-CM | POA: Diagnosis present

## 2016-01-06 DIAGNOSIS — W19XXXA Unspecified fall, initial encounter: Secondary | ICD-10-CM | POA: Diagnosis present

## 2016-01-06 DIAGNOSIS — J690 Pneumonitis due to inhalation of food and vomit: Secondary | ICD-10-CM | POA: Diagnosis not present

## 2016-01-06 DIAGNOSIS — R410 Disorientation, unspecified: Secondary | ICD-10-CM

## 2016-01-06 DIAGNOSIS — Z96652 Presence of left artificial knee joint: Secondary | ICD-10-CM | POA: Diagnosis present

## 2016-01-06 DIAGNOSIS — Z885 Allergy status to narcotic agent status: Secondary | ICD-10-CM

## 2016-01-06 DIAGNOSIS — S22019A Unspecified fracture of first thoracic vertebra, initial encounter for closed fracture: Secondary | ICD-10-CM | POA: Diagnosis present

## 2016-01-06 DIAGNOSIS — Z888 Allergy status to other drugs, medicaments and biological substances status: Secondary | ICD-10-CM

## 2016-01-06 DIAGNOSIS — G459 Transient cerebral ischemic attack, unspecified: Secondary | ICD-10-CM | POA: Diagnosis not present

## 2016-01-06 DIAGNOSIS — R531 Weakness: Secondary | ICD-10-CM

## 2016-01-06 DIAGNOSIS — Z4659 Encounter for fitting and adjustment of other gastrointestinal appliance and device: Secondary | ICD-10-CM

## 2016-01-06 DIAGNOSIS — J9601 Acute respiratory failure with hypoxia: Secondary | ICD-10-CM | POA: Diagnosis not present

## 2016-01-06 DIAGNOSIS — R102 Pelvic and perineal pain: Secondary | ICD-10-CM | POA: Diagnosis present

## 2016-01-06 DIAGNOSIS — E785 Hyperlipidemia, unspecified: Secondary | ICD-10-CM | POA: Diagnosis present

## 2016-01-06 LAB — COMPREHENSIVE METABOLIC PANEL
ALBUMIN: 3.4 g/dL — AB (ref 3.5–5.0)
ALK PHOS: 77 U/L (ref 38–126)
ALT: 51 U/L (ref 14–54)
ANION GAP: 9 (ref 5–15)
AST: 72 U/L — AB (ref 15–41)
BILIRUBIN TOTAL: 0.8 mg/dL (ref 0.3–1.2)
BUN: 37 mg/dL — AB (ref 6–20)
CALCIUM: 9.4 mg/dL (ref 8.9–10.3)
CO2: 25 mmol/L (ref 22–32)
CREATININE: 0.99 mg/dL (ref 0.44–1.00)
Chloride: 97 mmol/L — ABNORMAL LOW (ref 101–111)
GFR calc Af Amer: 60 mL/min — ABNORMAL LOW (ref >60)
GFR calc non Af Amer: 52 mL/min — ABNORMAL LOW (ref >60)
Glucose, Bld: 146 mg/dL — ABNORMAL HIGH (ref 65–99)
Potassium: 4.4 mmol/L (ref 3.5–5.1)
Sodium: 131 mmol/L — ABNORMAL LOW (ref 135–145)
Total Protein: 7.1 g/dL (ref 6.5–8.1)

## 2016-01-06 LAB — DIFFERENTIAL
Basophils Absolute: 0 10*3/uL (ref 0–0.1)
Basophils Relative: 0 %
EOS PCT: 0 %
Eosinophils Absolute: 0 10*3/uL (ref 0–0.7)
LYMPHS ABS: 1.9 10*3/uL (ref 1.0–3.6)
LYMPHS PCT: 13 %
Monocytes Absolute: 1.6 10*3/uL — ABNORMAL HIGH (ref 0.2–0.9)
Monocytes Relative: 11 %
NEUTROS ABS: 11.1 10*3/uL — AB (ref 1.4–6.5)
NEUTROS PCT: 76 %

## 2016-01-06 LAB — URINE DRUG SCREEN, QUALITATIVE (ARMC ONLY)
AMPHETAMINES, UR SCREEN: NOT DETECTED
BARBITURATES, UR SCREEN: NOT DETECTED
BENZODIAZEPINE, UR SCRN: POSITIVE — AB
Cannabinoid 50 Ng, Ur ~~LOC~~: NOT DETECTED
Cocaine Metabolite,Ur ~~LOC~~: NOT DETECTED
MDMA (Ecstasy)Ur Screen: NOT DETECTED
METHADONE SCREEN, URINE: NOT DETECTED
Opiate, Ur Screen: POSITIVE — AB
Phencyclidine (PCP) Ur S: NOT DETECTED
Tricyclic, Ur Screen: POSITIVE — AB

## 2016-01-06 LAB — PROTIME-INR
INR: 1.15
Prothrombin Time: 14.9 seconds (ref 11.4–15.0)

## 2016-01-06 LAB — URINALYSIS COMPLETE WITH MICROSCOPIC (ARMC ONLY)
Bilirubin Urine: NEGATIVE
GLUCOSE, UA: 50 mg/dL — AB
Ketones, ur: NEGATIVE mg/dL
Leukocytes, UA: NEGATIVE
Nitrite: NEGATIVE
PROTEIN: NEGATIVE mg/dL
Specific Gravity, Urine: 1.014 (ref 1.005–1.030)
pH: 5 (ref 5.0–8.0)

## 2016-01-06 LAB — CBC
HCT: 25.4 % — ABNORMAL LOW (ref 35.0–47.0)
HEMOGLOBIN: 8.6 g/dL — AB (ref 12.0–16.0)
MCH: 31.7 pg (ref 26.0–34.0)
MCHC: 33.7 g/dL (ref 32.0–36.0)
MCV: 94 fL (ref 80.0–100.0)
Platelets: 184 10*3/uL (ref 150–440)
RBC: 2.7 MIL/uL — AB (ref 3.80–5.20)
RDW: 16 % — ABNORMAL HIGH (ref 11.5–14.5)
WBC: 14.6 10*3/uL — ABNORMAL HIGH (ref 3.6–11.0)

## 2016-01-06 LAB — GLUCOSE, CAPILLARY: GLUCOSE-CAPILLARY: 125 mg/dL — AB (ref 65–99)

## 2016-01-06 LAB — APTT: aPTT: 29 seconds (ref 24–36)

## 2016-01-06 LAB — ETHANOL: Alcohol, Ethyl (B): <5 mg/dL (ref <5)

## 2016-01-06 LAB — TROPONIN I: TROPONIN I: 0.07 ng/mL — AB (ref <0.031)

## 2016-01-06 MED ORDER — ATORVASTATIN CALCIUM 20 MG PO TABS
80.0000 mg | ORAL_TABLET | Freq: Every day | ORAL | Status: DC
  Administered 2016-01-07: 17:00:00 80 mg via ORAL
  Filled 2016-01-06: qty 4

## 2016-01-06 MED ORDER — MELOXICAM 7.5 MG PO TABS
15.0000 mg | ORAL_TABLET | Freq: Every day | ORAL | Status: DC
  Administered 2016-01-07: 11:00:00 15 mg via ORAL
  Filled 2016-01-06 (×3): qty 1

## 2016-01-06 MED ORDER — METOPROLOL TARTRATE 50 MG PO TABS
50.0000 mg | ORAL_TABLET | Freq: Two times a day (BID) | ORAL | Status: DC
  Administered 2016-01-06 – 2016-01-08 (×4): 50 mg via ORAL
  Filled 2016-01-06 (×4): qty 1

## 2016-01-06 MED ORDER — SODIUM CHLORIDE 0.9% FLUSH
3.0000 mL | Freq: Two times a day (BID) | INTRAVENOUS | Status: DC
  Administered 2016-01-06 – 2016-01-07 (×3): 3 mL via INTRAVENOUS

## 2016-01-06 MED ORDER — LISINOPRIL 20 MG PO TABS
20.0000 mg | ORAL_TABLET | Freq: Two times a day (BID) | ORAL | Status: DC
  Administered 2016-01-06 – 2016-01-07 (×3): 20 mg via ORAL
  Filled 2016-01-06 (×4): qty 1

## 2016-01-06 MED ORDER — ADULT MULTIVITAMIN W/MINERALS CH
1.0000 | ORAL_TABLET | Freq: Every day | ORAL | Status: DC
  Administered 2016-01-07: 1 via ORAL
  Filled 2016-01-06: qty 1

## 2016-01-06 MED ORDER — CLOPIDOGREL BISULFATE 75 MG PO TABS
75.0000 mg | ORAL_TABLET | Freq: Every day | ORAL | Status: DC
  Administered 2016-01-06 – 2016-01-07 (×2): 75 mg via ORAL
  Filled 2016-01-06 (×2): qty 1

## 2016-01-06 MED ORDER — IOPAMIDOL (ISOVUE-370) INJECTION 76%
75.0000 mL | Freq: Once | INTRAVENOUS | Status: AC | PRN
  Administered 2016-01-06: 75 mL via INTRAVENOUS

## 2016-01-06 MED ORDER — ASPIRIN EC 81 MG PO TBEC
81.0000 mg | DELAYED_RELEASE_TABLET | Freq: Every day | ORAL | Status: DC
  Administered 2016-01-07: 81 mg via ORAL
  Filled 2016-01-06: qty 1

## 2016-01-06 MED ORDER — PANTOPRAZOLE SODIUM 40 MG PO TBEC
40.0000 mg | DELAYED_RELEASE_TABLET | Freq: Every day | ORAL | Status: DC
  Administered 2016-01-07: 40 mg via ORAL
  Filled 2016-01-06: qty 1

## 2016-01-06 MED ORDER — CLORAZEPATE DIPOTASSIUM 3.75 MG PO TABS
7.5000 mg | ORAL_TABLET | Freq: Every day | ORAL | Status: DC
  Administered 2016-01-07: 7.5 mg via ORAL
  Filled 2016-01-06: qty 1

## 2016-01-06 MED ORDER — LEVOTHYROXINE SODIUM 25 MCG PO TABS
50.0000 ug | ORAL_TABLET | Freq: Every day | ORAL | Status: DC
  Administered 2016-01-07: 50 ug via ORAL
  Filled 2016-01-06: qty 1

## 2016-01-06 MED ORDER — ASPIRIN 81 MG PO CHEW
324.0000 mg | CHEWABLE_TABLET | Freq: Once | ORAL | Status: AC
  Administered 2016-01-06: 324 mg via ORAL
  Filled 2016-01-06: qty 4

## 2016-01-06 MED ORDER — AMITRIPTYLINE HCL 100 MG PO TABS
50.0000 mg | ORAL_TABLET | Freq: Every day | ORAL | Status: DC
  Administered 2016-01-07: 50 mg via ORAL
  Filled 2016-01-06: qty 1

## 2016-01-06 MED ORDER — VITAMIN C 500 MG PO TABS
500.0000 mg | ORAL_TABLET | Freq: Every day | ORAL | Status: DC
  Administered 2016-01-07: 500 mg via ORAL
  Filled 2016-01-06: qty 1

## 2016-01-06 MED ORDER — ENSURE ENLIVE PO LIQD
237.0000 mL | Freq: Two times a day (BID) | ORAL | Status: DC
  Administered 2016-01-06 – 2016-01-07 (×3): 237 mL via ORAL
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
## 2016-01-06 NOTE — ED Notes (Signed)
Report given to Kennith Centerracey, RN on floor. Kennith Centerracey states that they can't accept patients between 1850 and 1920. Will take patient to floor after 1920.

## 2016-01-06 NOTE — ED Notes (Signed)
Pt presents to ED with complaints of fall three days ago. Pt husband reports this morning pt was having difficulty getting her words out. Pt husband reports pt is on blood thinners. Pt complains of right thigh pain.

## 2016-01-06 NOTE — ED Provider Notes (Signed)
[facility] Emergency Department Provider Note    ____________________________________________  Time seen: ~1130  I have reviewed the triage vital signs and the nursing notes.   HISTORY  Chief Complaint Fall   History limited by: Dysphagia   HPI Leah Preston is a 82 y.o. female who presents to the emergency department today because of concerns for difficulty with speech. It appears that this started this morning upon awakening. The patient's last known normal was yesterday before going to bed. This morning she has had difficulty with speech. She has been having a hard time getting the words out. The patient denies any significant headache. She did have a fall 3 days ago. No extremity numbness or weakness. No recent fevers.   Past Medical History  Diagnosis Date  . Peripheral vascular disease (HCC)     s/p carotid endarterectomy  . History of bladder problems   . Hypertension   . Hyperlipidemia   . OA (osteoarthritis of spine)     with chronic pain  . coronary artery disease   . subdurah/subarachnoid hemorrhage jun 2012    left parietal, sent to [facility]    Patient Active Problem List   Diagnosis Date Noted  . Pain in joint, shoulder region 05/04/2015  . TIA (transient ischemic attack) 01/28/2015  . Insomnia 01/28/2015  . Otitis media 05/23/2014  . Renal artery stenosis (HCC) 03/15/2014  . Impaired fasting glucose 06/14/2013  . Routine general medical examination at a health care facility 06/14/2013  . Peripheral vascular disease (HCC)   . Atherosclerotic peripheral vascular disease (HCC) 06/10/2011  . Hyperlipidemia   . Essential hypertension   . OA (osteoarthritis of spine)   . coronary artery disease   . History of spontaneous subarachnoid intracranial hemorrhage 02/17/2011    Past Surgical History  Procedure Laterality Date  . Cesarean section    . Carotid endarterectomy  jan 2012    left, complicated by large hematoma, evacuated  .  Fracture surgery  2006    Left radial pinning  . Joint replacement      Left TKR 2010, [facility]  . Bladder suspension  2003    [facility]  . Cataract extraction, bilateral  August 2013  . Abdominal hysterectomy  1968    menorrhagia  . Peripheral vascular catheterization Left 04/27/2015    Procedure: Lower Extremity Angiography;  Surgeon: Renford Dills, MD;  Location: [facility] INVASIVE CV LAB;  Service: Cardiovascular;  Laterality: Left;  . Peripheral vascular catheterization Left 04/27/2015    Procedure: Lower Extremity Intervention;  Surgeon: Renford Dills, MD;  Location: [facility] INVASIVE CV LAB;  Service: Cardiovascular;  Laterality: Left;    Current Outpatient Rx  Name  Route  Sig  Dispense  Refill  . amitriptyline (ELAVIL) 50 MG tablet      TAKE 1 TABLET DAILY   90 tablet   0   . amLODipine (NORVASC) 10 MG tablet   Oral   Take 1 tablet (10 mg total) by mouth daily.   90 tablet   3   . Ascorbic Acid (VITAMIN C PO)   Oral   Take by mouth daily.           Marland Kitchen aspirin 81 MG tablet   Oral   Take 81 mg by mouth daily.         Marland Kitchen atorvastatin (LIPITOR) 80 MG tablet   Oral   Take 1 tablet (80 mg total) by mouth daily at 6 PM.   90 tablet  3   . clopidogrel (PLAVIX) 75 MG tablet   Oral   Take 75 mg by mouth daily.         . clorazepate (TRANXENE) 7.5 MG tablet   Oral   Take 1 tablet (7.5 mg total) by mouth daily.   90 tablet   3   . esomeprazole (NEXIUM) 40 MG capsule      TAKE 1 CAPSULE DAILY   90 capsule   0   . levETIRAcetam (KEPPRA) 500 MG tablet   Oral   Take 500 mg by mouth daily.         Marland Kitchen. levothyroxine (SYNTHROID, LEVOTHROID) 50 MCG tablet      TAKE 1 TABLET DAILY   90 tablet   1   . lisinopril (PRINIVIL,ZESTRIL) 20 MG tablet      TAKE 1 TABLET TWICE A DAY   180 tablet   0   . meloxicam (MOBIC) 15 MG tablet      TAKE 1 TABLET DAILY   90 tablet   0   . metoprolol (LOPRESSOR) 50 MG tablet      TAKE 1 TABLET TWICE A DAY   180 tablet    0   . Multiple Vitamins-Minerals (CENTRUM SILVER PO)   Oral   Take by mouth daily.             Allergies Nitrofurantoin monohyd macro; Oxycodone hcl; and Tetanus toxoids  Family History  Problem Relation Age of Onset  . Stroke Mother     Social History Social History  Substance Use Topics  . Smoking status: Former Smoker    Quit date: 06/07/1995  . Smokeless tobacco: Never Used  . Alcohol Use: No    Review of Systems  Constitutional: Negative for fever. Cardiovascular: Negative for chest pain. Respiratory: Negative for shortness of breath. Gastrointestinal: Negative for abdominal pain, vomiting and diarrhea. Neurological: Negative for headaches, focal weakness or numbness.  10-point ROS otherwise negative.  ____________________________________________   PHYSICAL EXAM:  VITAL SIGNS: ED Triage Vitals  Enc Vitals Group     BP 04-02-2016 1107 130/52 mmHg     Pulse Rate 04-02-2016 1107 46     Resp 04-02-2016 1107 18     Temp 04-02-2016 1107 97.6 F (36.4 C)     Temp Source 04-02-2016 1107 Oral     SpO2 04-02-2016 1107 97 %     Weight 04-02-2016 1107 130 lb (58.968 kg)     Height 04-02-2016 1107 5\' 5"  (1.651 m)   Constitutional: Alert and oriented. Well appearing and in no distress. Eyes: Conjunctivae are normal. PERRL. Normal extraocular movements. ENT   Head: Normocephalic and atraumatic.   Nose: No congestion/rhinnorhea.   Mouth/Throat: Mucous membranes are moist.   Neck: No stridor. Hematological/Lymphatic/Immunilogical: No cervical lymphadenopathy. Cardiovascular: Normal rate, regular rhythm.  No murmurs, rubs, or gallops. Respiratory: Normal respiratory effort without tachypnea nor retractions. Breath sounds are clear and equal bilaterally. No wheezes/rales/rhonchi. Gastrointestinal: Soft and nontender. No distention. There is no CVA tenderness. Genitourinary: Deferred Musculoskeletal: Normal range of motion in all extremities. No joint effusions.  No  lower extremity tenderness nor edema. Neurologic:  Normal speech and language. No gross focal neurologic deficits are appreciated.  Skin:  Skin is warm, dry and intact. No rash noted. Psychiatric: Mood and affect are normal. Speech and behavior are normal. Patient exhibits appropriate insight and judgment.  ____________________________________________    LABS (pertinent positives/negatives)  Labs Reviewed  CBC - Abnormal; Notable for the following:  WBC 14.6 (*)    RBC 2.70 (*)    Hemoglobin 8.6 (*)    HCT 25.4 (*)    RDW 16.0 (*)    All other components within normal limits  DIFFERENTIAL - Abnormal; Notable for the following:    Neutro Abs 11.1 (*)    Monocytes Absolute 1.6 (*)    All other components within normal limits  COMPREHENSIVE METABOLIC PANEL - Abnormal; Notable for the following:    Sodium 131 (*)    Chloride 97 (*)    Glucose, Bld 146 (*)    BUN 37 (*)    Albumin 3.4 (*)    AST 72 (*)    GFR calc non Af Amer 52 (*)    GFR calc Af Amer 60 (*)    All other components within normal limits  URINALYSIS COMPLETEWITH MICROSCOPIC ([facility] ONLY) - Abnormal; Notable for the following:    Color, Urine YELLOW (*)    APPearance CLEAR (*)    Glucose, UA 50 (*)    Hgb urine dipstick 2+ (*)    Bacteria, UA RARE (*)    Squamous Epithelial / LPF 0-5 (*)    All other components within normal limits  TROPONIN I - Abnormal; Notable for the following:    Troponin I 0.07 (*)    All other components within normal limits  GLUCOSE, CAPILLARY - Abnormal; Notable for the following:    Glucose-Capillary 125 (*)    All other components within normal limits  URINE DRUG SCREEN, QUALITATIVE ([facility] ONLY) - Abnormal; Notable for the following:    Tricyclic, Ur Screen POSITIVE (*)    Opiate, Ur Screen POSITIVE (*)    Benzodiazepine, Ur Scrn POSITIVE (*)    All other components within normal limits  ETHANOL  PROTIME-INR  APTT  CBG MONITORING, ED      ____________________________________________   EKG  I, Phineas Semen, attending physician, personally viewed and interpreted this EKG  EKG Time: 1129 Rate: 115 Rhythm: sinus tachycardia Axis: normal Intervals: qtc 440 QRS: narrow, q waves V1 ST changes: no st elevation Impression: abnormal ekg  ____________________________________________    RADIOLOGY  CT head IMPRESSION: Age-related involutional change with no acute intracranial Abnormalities.  CTA head/neck  IMPRESSION: Right upper lobe infiltrate which may represent neoplasm or infection. Apical lung densities bilaterally most consistent with scarring. Follow-up chest x-ray and chest CT recommended for further evaluation.  Severe atherosclerotic disease with heavily calcified arteries in the aortic arch and both carotid and vertebral arteries diffusely.  Extensive plaque in the right common carotid artery 40% diameter stenosis proximal right internal carotid artery and 75% diameter stenosis origin right external carotid artery. Fibromuscular dysplasia cervical carotid without aneurysm or dissection  Atherosclerotic plaque in the left carotid with less than 50% diameter stenosis of the common carotid artery on the left. No significant internal carotid artery stenosis. There is fibromuscular dysplasia in the left internal carotid artery.  Moderate disease in distal vertebral artery bilaterally. Moderate stenosis of the posterior cerebral artery bilaterally.  Heavily calcified plaque in the cavernous carotid bilaterally with moderate to severe stenosis bilaterally atherosclerotic irregularity and mild stenosis of middle cerebral artery branches bilaterally.  Fractures of T1 and T3 indeterminate age  ____________________________________________   PROCEDURES  Procedure(s) performed: None  Critical Care performed: Yes, see critical care note(s)  CRITICAL CARE Performed by: Phineas Semen   Total critical care time: 30 minutes  Critical care time was exclusive of separately billable procedures and treating other patients.  Critical care was necessary to treat or prevent imminent or life-threatening deterioration.  Critical care was time spent personally by me on the following activities: development of treatment plan with patient and/or surrogate as well as nursing, discussions with consultants, evaluation of patient's response to treatment, examination of patient, obtaining history from patient or surrogate, ordering and performing treatments and interventions, ordering and review of laboratory studies, ordering and review of radiographic studies, pulse oximetry and re-evaluation of patient's condition.  ____________________________________________   INITIAL IMPRESSION / ASSESSMENT AND PLAN / ED COURSE  Pertinent labs & imaging results that were available during my care of the patient were reviewed by me and considered in my medical decision making (see chart for details).  Patient presented to the emergency department today because of concerns for difficulty with speech. The patient was called as a code stroke. Neurology did evaluate the patient. Patient was outside of the window for TPA given that last known normal last night before the patient went to bed. CTA was performed. This point no obvious Maisie Fus P patient will be admitted to the hospital service for further stroke workup.  ____________________________________________   FINAL CLINICAL IMPRESSION(S) / ED DIAGNOSES  Final diagnoses:  Stroke (cerebrum) (HCC)  Stroke (cerebrum) (HCC)     Phineas Semen, MD 12/25/2015 1511

## 2016-01-06 NOTE — ED Notes (Signed)
Pt assisted with bed pan. Pt has large bruise on inner right thigh; states she fell a few days ago in her RV

## 2016-01-06 NOTE — Consult Note (Addendum)
Referring Physician: Derrill KayGoodman    Chief Complaint: Confusion  HPI: Leah SjogrenRena D Preston is an 82 y.o. female who had a fall earlier in the week.  [place] time for pain and released.  Has been on pain medication since that time.  Went to bed normal last evening.  This morning awakened confused and continuing to complain of pain.  Patient was brought in for evaluation of pain.  In triage Code Stroke was called Initial NIHSS of 9.    Date last known well: 01/05/2016 Time last known well: Time: 22:00 tPA Given: No: Outside time window, h/o SAH  MRankin: 0  Past Medical History  Diagnosis Date  . Peripheral vascular disease (HCC)     s/p carotid endarterectomy  . History of bladder problems   . Hypertension   . Hyperlipidemia   . OA (osteoarthritis of spine)     with chronic pain  . coronary artery disease   . subdurah/subarachnoid hemorrhage jun 2012    [facility]    Past Surgical History  Procedure Laterality Date  . Cesarean section    . Carotid endarterectomy  jan 2012    left, complicated by large hematoma, evacuated  . Fracture surgery  2006    Left radial pinning  . Joint replacement      Left TKR 2010, [facility]  . Bladder suspension  2003    [facility]  . Cataract extraction, bilateral  August 2013  . Abdominal hysterectomy  1968    menorrhagia  . Peripheral vascular catheterization Left 04/27/2015    Procedure: Lower Extremity Angiography;  Surgeon: Renford DillsGregory G Schnier, MD;  Location: [facility] INVASIVE CV LAB;  Service: Cardiovascular;  Laterality: Left;  . Peripheral vascular catheterization Left 04/27/2015    Procedure: Lower Extremity Intervention;  Surgeon: Renford DillsGregory G Schnier, MD;  Location: [facility] INVASIVE CV LAB;  Service: Cardiovascular;  Laterality: Left;    Family History  Problem Relation Age of Onset  . Stroke Mother    Social History:  reports that she quit smoking about 20 years ago. She has never used smokeless tobacco. She reports  that she does not drink alcohol or use illicit drugs.  Allergies:  Allergies  Allergen Reactions  . Nitrofurantoin Monohyd Macro Other (See Comments)  . Oxycodone Hcl   . Tetanus Toxoids Swelling    Medications: I have reviewed the patient's current medications. Prior to Admission:  Prior to Admission medications   Medication Sig Start Date End Date Taking? Authorizing Provider  amitriptyline (ELAVIL) 50 MG tablet TAKE 1 TABLET DAILY 10/11/15   Sherlene Shamseresa L Tullo, MD  amLODipine (NORVASC) 10 MG tablet Take 1 tablet (10 mg total) by mouth daily. 06/15/15   Sherlene Shamseresa L Tullo, MD  Ascorbic Acid (VITAMIN C PO) Take by mouth daily.      Historical Provider, MD  aspirin 81 MG tablet Take 81 mg by mouth daily.    Historical Provider, MD  atorvastatin (LIPITOR) 80 MG tablet Take 1 tablet (80 mg total) by mouth daily at 6 PM. 06/15/15   Sherlene Shamseresa L Tullo, MD  clopidogrel (PLAVIX) 75 MG tablet Take 75 mg by mouth daily. 12/15/14   Historical Provider, MD  clorazepate (TRANXENE) 7.5 MG tablet Take 1 tablet (7.5 mg total) by mouth daily. 04/19/15   Sherlene Shamseresa L Tullo, MD  esomeprazole (NEXIUM) 40 MG capsule TAKE 1 CAPSULE DAILY 11/02/15   Sherlene Shamseresa L Tullo, MD  levETIRAcetam (KEPPRA) 500 MG tablet Take 500 mg  by mouth daily.    Historical Provider, MD  levothyroxine (SYNTHROID, LEVOTHROID) 50 MCG tablet TAKE 1 TABLET DAILY 10/11/15   Sherlene Shams, MD  lisinopril (PRINIVIL,ZESTRIL) 20 MG tablet TAKE 1 TABLET TWICE A DAY 11/30/15   Sherlene Shams, MD  meloxicam (MOBIC) 15 MG tablet TAKE 1 TABLET DAILY 10/11/15   Sherlene Shams, MD  metoprolol (LOPRESSOR) 50 MG tablet TAKE 1 TABLET TWICE A DAY 10/20/15   Sherlene Shams, MD  Multiple Vitamins-Minerals (CENTRUM SILVER PO) Take by mouth daily.      Historical Provider, MD    ROS: History obtained from the patient  General ROS: negative for - chills, fatigue, fever, night sweats, weight gain or weight loss Psychological ROS: negative for - behavioral disorder, hallucinations,  memory difficulties, mood swings or suicidal ideation Ophthalmic ROS: negative for - blurry vision, double vision, eye pain or loss of vision ENT ROS: negative for - epistaxis, nasal discharge, oral lesions, sore throat, tinnitus or vertigo Allergy and Immunology ROS: negative for - hives or itchy/watery eyes Hematological and Lymphatic ROS: negative for - bleeding problems, bruising or swollen lymph nodes Endocrine ROS: negative for - galactorrhea, hair pattern changes, polydipsia/polyuria or temperature intolerance Respiratory ROS: negative for - cough, hemoptysis, shortness of breath or wheezing Cardiovascular ROS: negative for - chest pain, dyspnea on exertion, edema or irregular heartbeat Gastrointestinal ROS: negative for - abdominal pain, diarrhea, hematemesis, nausea/vomiting or stool incontinence Genito-Urinary ROS: negative for - dysuria, hematuria, incontinence or urinary frequency/urgency Musculoskeletal ROS: right leg pain Neurological ROS: as noted in HPI Dermatological ROS: negative for rash and skin lesion changes  Physical Examination: Blood pressure 135/50, pulse 46, temperature 97.6 F (36.4 C), temperature source Oral, resp. rate 17, height  (1.651 m), weight 58.968 kg (130 lb), SpO2 97 %.  HEENT-  Normocephalic, no lesions, without obvious abnormality.  Normal external eye and conjunctiva.  Normal TM's bilaterally.  Normal auditory canals and external ears. Normal external nose, mucus membranes and septum.  Normal pharynx. Cardiovascular- S1, S2 normal, pulses palpable throughout   Lungs- chest clear, no wheezing, rales, normal symmetric air entry Abdomen- soft, non-tender; bowel sounds normal; no masses,  no organomegaly Extremities- no edema Lymph-no adenopathy palpable Musculoskeletal-right hip tenderness Skin-multiple areas of brusing  Neurological Examination Mental Status: Alert.  Unable to give me the month.  Reports she is 82 years old.  Speech nonfluent  with multiple paraphasic errors and at times gibberish being expressed.  Difficulty following even simple commands.   Cranial Nerves: II: Discs flat bilaterally; Does not blink to bilateral confrontation but appears to focus on objects in the room.  Pupils equal, round, reactive to light and accommodation III,IV, VI: ptosis not present, extra-ocular motions intact bilaterally V,VII: decrease right NLF, facial light touch sensation normal bilaterally VIII: hearing normal bilaterally IX,X: gag reflex present XI: bilateral shoulder shrug XII: midline tongue extension Motor: Gives full strength in the upper extremities.  Lifts RLE but allows it to drop quickly to the bed.  Able to maintain the LLE only and inch or two off the bed.   Sensory: Pinprick and light touch intact throughout, bilaterally Deep Tendon Reflexes: 2+ and symmetric throughout Plantars: Right: downgoing   Left: downgoing Cerebellar: Normal finger-to-nose testing bilaterally Gait: not tested due to safety concerns    Laboratory Studies:  Basic Metabolic Panel: No results for input(s): NA, K, CL, CO2, GLUCOSE, BUN, CREATININE, CALCIUM, MG, PHOS in the last 168 hours.  Liver Function Tests: No  results for input(s): AST, ALT, ALKPHOS, BILITOT, PROT, ALBUMIN in the last 168 hours. No results for input(s): LIPASE, AMYLASE in the last 168 hours. No results for input(s): AMMONIA in the last 168 hours.  CBC: No results for input(s): WBC, NEUTROABS, HGB, HCT, MCV, PLT in the last 168 hours.  Cardiac Enzymes: No results for input(s): CKTOTAL, CKMB, CKMBINDEX, TROPONINI in the last 168 hours.  BNP: Invalid input(s): POCBNP  CBG:  Recent Labs Lab 12/31/2015 1133  GLUCAP 125*    Microbiology: Results for orders placed or performed in visit on 06/24/13  Fecal occult blood, imunochemical     Status: None   Collection Time: 06/24/13  2:49 PM  Result Value Ref Range Status   Fecal Occult Bld Negative Negative Final     Coagulation Studies: No results for input(s): LABPROT, INR in the last 72 hours.  Urinalysis: No results for input(s): COLORURINE, LABSPEC, PHURINE, GLUCOSEU, HGBUR, BILIRUBINUR, KETONESUR, PROTEINUR, UROBILINOGEN, NITRITE, LEUKOCYTESUR in the last 168 hours.  Invalid input(s): APPERANCEUR  Lipid Panel:    Component Value Date/Time   CHOL 154 05/04/2015 0922   TRIG 179.0* 05/04/2015 0922   HDL 55.30 05/04/2015 0922   CHOLHDL 3 05/04/2015 0922   VLDL 35.8 05/04/2015 0922   LDLCALC 63 05/04/2015 0922    HgbA1C:  Lab Results  Component Value Date   HGBA1C 5.8 05/04/2015    Urine Drug Screen:  No results found for: LABOPIA, COCAINSCRNUR, LABBENZ, AMPHETMU, THCU, LABBARB  Alcohol Level: No results for input(s): ETH in the last 168 hours.  Other results: EKG: sinus tachycardia.  Imaging: Ct Head Wo Contrast  12/27/2015  CLINICAL DATA:  Larey Seat 3 days ago, altered mental status with difficulty speaking today, patient is on blood thinners EXAM: CT HEAD WITHOUT CONTRAST TECHNIQUE: Contiguous axial images were obtained from the base of the skull through the vertex without intravenous contrast. COMPARISON:  02/23/2015 FINDINGS: No hemorrhage or extra-axial fluid. Diffuse cortical atrophy with advanced low attenuation in the deep white matter. No evidence of vascular territory infarct or mass. No hydrocephalus. There is no hemorrhage or extra-axial fluid. No skull fracture is identified. The visualized portions of the paranasal sinuses are clear. IMPRESSION: Age-related involutional change with no acute intracranial abnormalities. Critical Value/emergent results were called by telephone at the time of interpretation on 12/30/2015 at 11:38 am to Dr. Phineas Semen , who verbally acknowledged these results. Electronically Signed   By: Esperanza Heir M.D.   On: 01/09/2016 11:38    Assessment: 82 y.o. female presenting with confusion.  Appears to have an aphasia on neurological examination.   No apparent limb weakness although there is some limitation due to pain.  Patient outside time window for tPA.  Will rule out the need for intervention.  Patient on ASA and Plavix at home.    Stroke Risk Factors - hyperlipidemia and hypertension  Plan: 1. HgbA1c, fasting lipid panel 2. MRI of the brain without contrast 3. PT consult, OT consult, Speech consult 4. Echocardiogram 5. CTA of head and neck once renal function able to be evaluated 6. Prophylactic therapy-Continue ASA and Plavix 7. NPO until RN stroke swallow screen 8. Telemetry monitoring 9. Frequent neuro checks  Case discussed with Dr. Clotilde Dieter, MD Neurology [phone] 01/11/2016, 11:59 AM

## 2016-01-06 NOTE — ED Notes (Signed)
Called Receptionist to notify her of code stroke.

## 2016-01-07 ENCOUNTER — Other Ambulatory Visit: Payer: Self-pay | Admitting: Internal Medicine

## 2016-01-07 ENCOUNTER — Observation Stay: Payer: Medicare Other

## 2016-01-07 DIAGNOSIS — G459 Transient cerebral ischemic attack, unspecified: Secondary | ICD-10-CM

## 2016-01-07 LAB — LIPID PANEL
CHOLESTEROL: 132 mg/dL (ref 0–200)
HDL: 59 mg/dL (ref >40)
LDL Cholesterol: 39 mg/dL (ref 0–99)
Total CHOL/HDL Ratio: 2.2 RATIO
Triglycerides: 172 mg/dL — ABNORMAL HIGH (ref <150)
VLDL: 34 mg/dL (ref 0–40)

## 2016-01-07 LAB — BASIC METABOLIC PANEL
Anion gap: 8 (ref 5–15)
BUN: 28 mg/dL — AB (ref 6–20)
CALCIUM: 9.4 mg/dL (ref 8.9–10.3)
CO2: 26 mmol/L (ref 22–32)
CREATININE: 0.61 mg/dL (ref 0.44–1.00)
Chloride: 100 mmol/L — ABNORMAL LOW (ref 101–111)
GFR calc Af Amer: >60 mL/min (ref >60)
GLUCOSE: 129 mg/dL — AB (ref 65–99)
POTASSIUM: 4.6 mmol/L (ref 3.5–5.1)
Sodium: 134 mmol/L — ABNORMAL LOW (ref 135–145)

## 2016-01-07 LAB — CBC
HEMATOCRIT: 26 % — AB (ref 35.0–47.0)
Hemoglobin: 8.8 g/dL — ABNORMAL LOW (ref 12.0–16.0)
MCH: 31.8 pg (ref 26.0–34.0)
MCHC: 33.7 g/dL (ref 32.0–36.0)
MCV: 94.2 fL (ref 80.0–100.0)
Platelets: 185 10*3/uL (ref 150–440)
RBC: 2.76 MIL/uL — ABNORMAL LOW (ref 3.80–5.20)
RDW: 15.6 % — AB (ref 11.5–14.5)
WBC: 12.4 10*3/uL — ABNORMAL HIGH (ref 3.6–11.0)

## 2016-01-07 LAB — HEMOGLOBIN A1C: Hgb A1c MFr Bld: 5.8 % (ref 4.0–6.0)

## 2016-01-07 MED ORDER — TRAMADOL HCL 50 MG PO TABS: 50.0000 mg | ORAL_TABLET | Freq: Four times a day (QID) | ORAL | Status: DC

## 2016-01-07 MED ORDER — LEVOFLOXACIN 500 MG PO TABS: 250.0000 mg | ORAL_TABLET | Freq: Every day | ORAL | Status: DC

## 2016-01-07 MED ORDER — ALUM & MAG HYDROXIDE-SIMETH 200-200-20 MG/5ML PO SUSP
30.0000 mL | ORAL | Status: DC | PRN
  Administered 2016-01-07: 30 mL via ORAL
  Filled 2016-01-07: qty 30

## 2016-01-07 MED ORDER — ACETAMINOPHEN 325 MG PO TABS: 650.0000 mg | ORAL_TABLET | Freq: Four times a day (QID) | ORAL | Status: DC | PRN

## 2016-01-07 MED ORDER — AMLODIPINE BESYLATE 10 MG PO TABS
10.0000 mg | ORAL_TABLET | Freq: Every day | ORAL | Status: DC
  Administered 2016-01-07: 10 mg via ORAL
  Filled 2016-01-07 (×2): qty 1

## 2016-01-07 MED ORDER — LEVOFLOXACIN 500 MG PO TABS
500.0000 mg | ORAL_TABLET | Freq: Once | ORAL | Status: AC
  Administered 2016-01-07: 17:00:00 500 mg via ORAL
  Filled 2016-01-07: qty 1

## 2016-01-07 MED ORDER — TRAMADOL HCL 50 MG PO TABS
50.0000 mg | ORAL_TABLET | Freq: Four times a day (QID) | ORAL | Status: DC | PRN
  Administered 2016-01-07: 50 mg via ORAL
  Filled 2016-01-07: qty 1

## 2016-01-07 MED ORDER — ACETAMINOPHEN 325 MG PO TABS
650.0000 mg | ORAL_TABLET | Freq: Four times a day (QID) | ORAL | Status: DC | PRN
  Administered 2016-01-07: 650 mg via ORAL
  Filled 2016-01-07: qty 2

## 2016-01-07 NOTE — Care Management Obs Status (Signed)
MEDICARE OBSERVATION STATUS NOTIFICATION   Patient Details  Name: Leah Preston MRN: [id number] Date of Birth: 08/07/1931   Medicare Observation Status Notification Given:  Yes    Gwenette GreetBrenda S Milas Schappell, RN 01/07/2016, 10:58 AM

## 2016-01-07 NOTE — Progress Notes (Signed)
Subjective: Speech improved.  Complains of abdominal pain/distension.    Objective: Current vital signs: BP 186/63 mmHg  Pulse 106  Temp(Src) 98.1 F (36.7 C) (Oral)  Resp 15  Ht  (1.626 m)  Wt 62.234 kg (137 lb 3.2 oz)  BMI 23.54 kg/m2  SpO2 92% Vital signs in last 24 hours: Temp:  [97.6 F (36.4 C)-98.4 F (36.9 C)] 98.1 F (36.7 C) (04/21 0924) Pulse Rate:  [87-110] 106 (04/21 0924) Resp:  [13-22] 15 (04/21 0924) BP: (94-186)/(46-138) 186/63 mmHg (04/21 0924) SpO2:  [92 %-100 %] 92 % (04/21 0924) Weight:  [62.234 kg (137 lb 3.2 oz)] 62.234 kg (137 lb 3.2 oz) (04/20 2046)  Intake/Output from previous day: 04/20 0701 - 04/21 0700 In: -  Out: 950 [Urine:950] Intake/Output this shift: Total I/O In: 240 [P.O.:240] Out: -  Nutritional status: Diet heart healthy/carb modified Room service appropriate?: Yes; Fluid consistency:: Thin  Neurologic Exam: Mental Status: Alert, oriented, thought content appropriate. Speech fluent without evidence of aphasia. Able to follow 3 step commands without difficulty. Cranial Nerves: II: Discs flat bilaterally; Visual fields grossly normal, pupils equal, round, reactive to light and accommodation III,IV, VI: ptosis not present, extra-ocular motions intact bilaterally V,VII: decrease in right NLF, facial light touch sensation normal bilaterally VIII: hearing normal bilaterally IX,X: gag reflex present XI: bilateral shoulder shrug XII: midline tongue extension Motor: Lifts all extremities off the bed.  Lower extremities remain limited by pain.   Sensory: Pinprick and light touch intact throughout, bilaterally   Lab Results: Basic Metabolic Panel:  Recent Labs Lab 01/05/2016 1126 01/07/16 0616  NA 131* 134*  K 4.4 4.6  CL 97* 100*  CO2 25 26  GLUCOSE 146* 129*  BUN 37* 28*  CREATININE 0.99 0.61  CALCIUM 9.4 9.4    Liver Function Tests:  Recent Labs Lab 01/13/2016 1126  AST 72*  ALT 51  ALKPHOS 77  BILITOT 0.8   PROT 7.1  ALBUMIN 3.4*   No results for input(s): LIPASE, AMYLASE in the last 168 hours. No results for input(s): AMMONIA in the last 168 hours.  CBC:  Recent Labs Lab 01/13/2016 1126 01/07/16 0616  WBC 14.6* 12.4*  NEUTROABS 11.1*  --   HGB 8.6* 8.8*  HCT 25.4* 26.0*  MCV 94.0 94.2  PLT 184 185    Cardiac Enzymes:  Recent Labs Lab 01/09/2016 1126  TROPONINI 0.07*    Lipid Panel: No results for input(s): CHOL, TRIG, HDL, CHOLHDL, VLDL, LDLCALC in the last 168 hours.  CBG:  Recent Labs Lab 01/11/2016 1133  GLUCAP 125*    Microbiology: Results for orders placed or performed in visit on 06/24/13  Fecal occult blood, imunochemical     Status: None   Collection Time: 06/24/13  2:49 PM  Result Value Ref Range Status   Fecal Occult Bld Negative Negative Final    Coagulation Studies:  Recent Labs  01/03/2016 1126  LABPROT 14.9  INR 1.15    Imaging: Ct Angio Head W/cm &/or Wo Cm  01/16/2016  ADDENDUM REPORT: 01/14/2016 14:15 ADDENDUM: Asymmetry of the tongue is noted with fatty changes in the left tongue. This is probably due to denervation. No mass lesion is seen in the skullbase. Electronically Signed   By: Marlan Palau M.D.   On: 01/04/2016 14:15  01/05/2016  CLINICAL DATA:  Stroke.  Altered mental status and confusion EXAM: CT ANGIOGRAPHY HEAD AND NECK TECHNIQUE: Multidetector CT imaging of the head and neck was performed using the standard protocol during  bolus administration of intravenous contrast. Multiplanar CT image reconstructions and MIPs were obtained to evaluate the vascular anatomy. Carotid stenosis measurements (when applicable) are obtained utilizing NASCET criteria, using the distal internal carotid diameter as the denominator. CONTRAST:  75 mL Isovue 370 IV COMPARISON:  CT head 12/18/2015 FINDINGS: CTA NECK Aortic arch: Extensive atherosclerotic calcification in the aortic arch without aneurysm or dissection. Advanced atherosclerotic calcification in  the proximal great vessels. Atherosclerotic calcification throughout the subclavian arteries bilaterally. Right upper lobe infiltrate. This may represent neoplasm or infection. Follow-up with chest x-ray and chest CT recommended. Right apical pleural-based irregular thickening likely due to scarring. Left apical scarring also noted. Right carotid system: Atherosclerotic calcification throughout the right common carotid artery with less than 50% diameter stenosis in multiple areas. Calcified and noncalcified plaque in the right carotid bifurcation. 40% diameter stenosis proximal right internal carotid artery. 75% stenosis origin right external carotid artery. Findings consistent with fibromuscular dysplasia in the cervical carotid without aneurysm or dissection. Left carotid system: Advanced atherosclerotic disease in the common carotid artery with multiple areas of less than 50% diameter stenosis. Noncalcified plaque in the left common carotid artery without significant stenosis on the left. Changes of fibromuscular dysplasia in the left internal carotid artery without dissection or aneurysm. Vertebral arteries:Both vertebral arteries patent to the basilar. Mild atherosclerotic disease at the origin of both vertebral arteries. Mild stenosis in the mid left vertebral artery. Atherosclerotic calcification and moderate stenosis the distal vertebral artery bile Skeleton: Moderate cervical disc and a facet degeneration. Mild compression fracture T1 and moderate to severe compression fracture T3. These are of indeterminate age. Other neck: Negative for adenopathy in the neck. CTA HEAD Anterior circulation: Advanced atherosclerotic calcification in the cavernous carotid bilaterally with moderate to severe stenosis bilaterally in the cavernous segment. No aneurysm. Anterior and middle cerebral arteries patent bilaterally. Moderate disease in the A2 segment bilaterally. Mild disease in the middle cerebral artery branches  bilaterally without segmental occlusion or critical stenosis. Posterior circulation: Atherosclerotic calcification distal vertebral artery bilaterally causing moderate stenosis. PICA patent bilaterally. Basilar patent with mild atherosclerotic plaque. Atherosclerotic disease with moderate stenosis in the posterior cerebral artery bilaterally. Negative for cerebral aneurysm Venous sinuses: Patent Anatomic variants: None Delayed phase: Normal enhancement on delayed imaging. Atrophy and moderate chronic microvascular ischemic changes in the white matter. No acute cortical infarct. IMPRESSION: Right upper lobe infiltrate which may represent neoplasm or infection. Apical lung densities bilaterally most consistent with scarring. Follow-up chest x-ray and chest CT recommended for further evaluation. Severe atherosclerotic disease with heavily calcified arteries in the aortic arch and both carotid and vertebral arteries diffusely. Extensive plaque in the right common carotid artery 40% diameter stenosis proximal right internal carotid artery and 75% diameter stenosis origin right external carotid artery. Fibromuscular dysplasia cervical carotid without aneurysm or dissection Atherosclerotic plaque in the left carotid with less than 50% diameter stenosis of the common carotid artery on the left. No significant internal carotid artery stenosis. There is fibromuscular dysplasia in the left internal carotid artery. Moderate disease in distal vertebral artery bilaterally. Moderate stenosis of the posterior cerebral artery bilaterally. Heavily calcified plaque in the cavernous carotid bilaterally with moderate to severe stenosis bilaterally atherosclerotic irregularity and mild stenosis of middle cerebral artery branches bilaterally. Fractures of T1 and T3 indeterminate age Electronically Signed: By: Marlan Palau M.D. On: 12/20/2015 14:00   Dg Chest 2 View  12/24/2015  CLINICAL DATA:  Recent fall.  Pain.  Initial encounter.  EXAM: CHEST  2  VIEW COMPARISON:  Single view of the chest 03/15/2011. FINDINGS: The lungs are clear. There is cardiomegaly. No pneumothorax or pleural effusion. Aortic atherosclerosis is seen. Degenerative change is present about the shoulders. IMPRESSION: No acute disease. Electronically Signed   By: Drusilla Kannerhomas  Dalessio M.D.   On: 01/12/2016 17:29   Ct Head Wo Contrast  12/21/2015  CLINICAL DATA:  Larey SeatFell 3 days ago, altered mental status with difficulty speaking today, patient is on blood thinners EXAM: CT HEAD WITHOUT CONTRAST TECHNIQUE: Contiguous axial images were obtained from the base of the skull through the vertex without intravenous contrast. COMPARISON:  02/23/2015 FINDINGS: No hemorrhage or extra-axial fluid. Diffuse cortical atrophy with advanced low attenuation in the deep white matter. No evidence of vascular territory infarct or mass. No hydrocephalus. There is no hemorrhage or extra-axial fluid. No skull fracture is identified. The visualized portions of the paranasal sinuses are clear. IMPRESSION: Age-related involutional change with no acute intracranial abnormalities. Critical Value/emergent results were called by telephone at the time of interpretation on 12/30/2015 at 11:38 am to Dr. Phineas SemenGRAYDON GOODMAN , who verbally acknowledged these results. Electronically Signed   By: Esperanza Heiraymond  Rubner M.D.   On: 12/29/2015 11:38   Ct Angio Neck W/cm &/or Wo/cm  01/13/2016  ADDENDUM REPORT: 01/02/2016 14:15 ADDENDUM: Asymmetry of the tongue is noted with fatty changes in the left tongue. This is probably due to denervation. No mass lesion is seen in the skullbase. Electronically Signed   By: Marlan Palauharles  Clark M.D.   On: 01/14/2016 14:15  12/28/2015  CLINICAL DATA:  Stroke.  Altered mental status and confusion EXAM: CT ANGIOGRAPHY HEAD AND NECK TECHNIQUE: Multidetector CT imaging of the head and neck was performed using the standard protocol during bolus administration of intravenous contrast. Multiplanar CT image  reconstructions and MIPs were obtained to evaluate the vascular anatomy. Carotid stenosis measurements (when applicable) are obtained utilizing NASCET criteria, using the distal internal carotid diameter as the denominator. CONTRAST:  75 mL Isovue 370 IV COMPARISON:  CT head 01/12/2016 FINDINGS: CTA NECK Aortic arch: Extensive atherosclerotic calcification in the aortic arch without aneurysm or dissection. Advanced atherosclerotic calcification in the proximal great vessels. Atherosclerotic calcification throughout the subclavian arteries bilaterally. Right upper lobe infiltrate. This may represent neoplasm or infection. Follow-up with chest x-ray and chest CT recommended. Right apical pleural-based irregular thickening likely due to scarring. Left apical scarring also noted. Right carotid system: Atherosclerotic calcification throughout the right common carotid artery with less than 50% diameter stenosis in multiple areas. Calcified and noncalcified plaque in the right carotid bifurcation. 40% diameter stenosis proximal right internal carotid artery. 75% stenosis origin right external carotid artery. Findings consistent with fibromuscular dysplasia in the cervical carotid without aneurysm or dissection. Left carotid system: Advanced atherosclerotic disease in the common carotid artery with multiple areas of less than 50% diameter stenosis. Noncalcified plaque in the left common carotid artery without significant stenosis on the left. Changes of fibromuscular dysplasia in the left internal carotid artery without dissection or aneurysm. Vertebral arteries:Both vertebral arteries patent to the basilar. Mild atherosclerotic disease at the origin of both vertebral arteries. Mild stenosis in the mid left vertebral artery. Atherosclerotic calcification and moderate stenosis the distal vertebral artery bile Skeleton: Moderate cervical disc and a facet degeneration. Mild compression fracture T1 and moderate to severe  compression fracture T3. These are of indeterminate age. Other neck: Negative for adenopathy in the neck. CTA HEAD Anterior circulation: Advanced atherosclerotic calcification in the cavernous carotid bilaterally with moderate to severe stenosis  bilaterally in the cavernous segment. No aneurysm. Anterior and middle cerebral arteries patent bilaterally. Moderate disease in the A2 segment bilaterally. Mild disease in the middle cerebral artery branches bilaterally without segmental occlusion or critical stenosis. Posterior circulation: Atherosclerotic calcification distal vertebral artery bilaterally causing moderate stenosis. PICA patent bilaterally. Basilar patent with mild atherosclerotic plaque. Atherosclerotic disease with moderate stenosis in the posterior cerebral artery bilaterally. Negative for cerebral aneurysm Venous sinuses: Patent Anatomic variants: None Delayed phase: Normal enhancement on delayed imaging. Atrophy and moderate chronic microvascular ischemic changes in the white matter. No acute cortical infarct. IMPRESSION: Right upper lobe infiltrate which may represent neoplasm or infection. Apical lung densities bilaterally most consistent with scarring. Follow-up chest x-ray and chest CT recommended for further evaluation. Severe atherosclerotic disease with heavily calcified arteries in the aortic arch and both carotid and vertebral arteries diffusely. Extensive plaque in the right common carotid artery 40% diameter stenosis proximal right internal carotid artery and 75% diameter stenosis origin right external carotid artery. Fibromuscular dysplasia cervical carotid without aneurysm or dissection Atherosclerotic plaque in the left carotid with less than 50% diameter stenosis of the common carotid artery on the left. No significant internal carotid artery stenosis. There is fibromuscular dysplasia in the left internal carotid artery. Moderate disease in distal vertebral artery bilaterally. Moderate  stenosis of the posterior cerebral artery bilaterally. Heavily calcified plaque in the cavernous carotid bilaterally with moderate to severe stenosis bilaterally atherosclerotic irregularity and mild stenosis of middle cerebral artery branches bilaterally. Fractures of T1 and T3 indeterminate age Electronically Signed: By: Marlan Palau M.D. On: 01-12-16 14:00   Dg Hip Unilat With Pelvis 2-3 Views Left  01/12/16  CLINICAL DATA:  Recent fall with bilateral hip pain, initial encounter EXAM: DG HIP (WITH OR WITHOUT PELVIS) 2-3V LEFT COMPARISON:  None. FINDINGS: Pelvic ring is intact. No acute fracture or dislocation is seen. Bilateral iliac stenting is noted. Contrast material is noted within the bladder consistent with the recent CT angiography. IMPRESSION: No acute abnormality noted. Electronically Signed   By: Alcide Clever M.D.   On: 12-Jan-2016 17:23   Dg Hip Unilat With Pelvis 2-3 Views Right  2016/01/12  CLINICAL DATA:  Recent fall with right hip pain, initial encounter EXAM: DG HIP (WITH OR WITHOUT PELVIS) 2-3V RIGHT COMPARISON:  None. FINDINGS: There is no evidence of hip fracture or dislocation. There is no evidence of arthropathy or other focal bone abnormality. IMPRESSION: No acute abnormality noted. Electronically Signed   By: Alcide Clever M.D.   On: 01-12-2016 17:24    Medications:  I have reviewed the patient's current medications. Scheduled: . amitriptyline  50 mg Oral Daily  . aspirin EC  81 mg Oral Daily  . atorvastatin  80 mg Oral q1800  . clopidogrel  75 mg Oral Daily  . clorazepate  7.5 mg Oral Daily  . feeding supplement (ENSURE ENLIVE)  237 mL Oral BID BM  . levothyroxine  50 mcg Oral QAC breakfast  . lisinopril  20 mg Oral BID  . meloxicam  15 mg Oral Daily  . metoprolol  50 mg Oral BID  . multivitamin with minerals  1 tablet Oral Daily  . pantoprazole  40 mg Oral Daily  . sodium chloride flush  3 mL Intravenous Q12H  . vitamin C  500 mg Oral Daily     Assessment/Plan: Patient continues to complain of pain.  Her speech is much improved.  Head CT on yesterday was unremarkable.  CTA shows diffuse disease but no  intervenable disease noted.  There was concern for a T1 and T3 fracture.  Patient on ASA and Plavix.  Recommendations: 1.  Continue ASA and Plavix 2.  MRI, lab work and echocardiogram pending    Thana Farr, MD Neurology [phone] 01/07/2016  11:25 AM
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
## 2016-01-07 NOTE — Progress Notes (Signed)
Took over care from previous nurse at approx. 15:00, pt is alert with intermittent confusion, c/o pain in leg, tramadol ordered q6h. MRI head performed during shift, evidence of small stroke, stroke education provided to patient and family at bedside.

## 2016-01-07 NOTE — Progress Notes (Signed)
Spoke with Dr. Enedina FinnerSona Patel regarding patient's complaint of inability to void and no BM since Sunday.  Bladder scan shows 371.  Ordered received for 1x I&O cath.

## 2016-01-07 NOTE — Care Management (Signed)
Admitted to [facility] with hip pain. Lives with 3 friends in a home. Last seen Dr. Darrick Huntsmanullo last October. No home Health. No skilled facility. Uses a cane/rolling walker to aid in ambulation. Takes care of all basic and instrumental activities of daily living herself, drives. Fell in bathroom of RV. Good appetite. Gwenette GreetBrenda S Miabella Shannahan RN MSN CCM Care Management [phone]

## 2016-01-07 NOTE — Progress Notes (Signed)
ANTIBIOTIC CONSULT NOTE - INITIAL  Pharmacy Consult for Levaquin  Indication: unspecified infection, elevated WBC   Allergies  Allergen Reactions  . Nitrofurantoin Monohyd Macro Other (See Comments)  . Oxycodone Hcl   . Tetanus Toxoids Swelling    Patient Measurements: Height: 5\' 4"  (162.6 cm) Weight: 137 lb 3.2 oz (62.234 kg) IBW/kg (Calculated) : 54.7 Adjusted Body Weight:   Vital Signs: Temp: 98.3 F (36.8 C) (04/21 1315) Temp Source: Oral (04/21 1315) BP: 177/58 mmHg (04/21 1315) Pulse Rate: 111 (04/21 1315) Intake/Output from previous day: 04/20 0701 - 04/21 0700 In: -  Out: 950 [Urine:950] Intake/Output from this shift: Total I/O In: 240 [P.O.:240] Out: 750 [Urine:750]  Labs:  Recent Labs  01/04/2016 1126 01/07/16 0616  WBC 14.6* 12.4*  HGB 8.6* 8.8*  PLT 184 185  CREATININE 0.99 0.61   Estimated Creatinine Clearance: 46.8 mL/min (by C-G formula based on Cr of 0.61). No results for input(s): VANCOTROUGH, VANCOPEAK, VANCORANDOM, GENTTROUGH, GENTPEAK, GENTRANDOM, TOBRATROUGH, TOBRAPEAK, TOBRARND, AMIKACINPEAK, AMIKACINTROU, AMIKACIN in the last 72 hours.   Microbiology: No results found for this or any previous visit (from the past 720 hour(s)).  Medical History: Past Medical History  Diagnosis Date  . Peripheral vascular disease (HCC)     s/p carotid endarterectomy  . History of bladder problems   . Hypertension   . Hyperlipidemia   . OA (osteoarthritis of spine)     with chronic pain  . coronary artery disease   . subdurah/subarachnoid hemorrhage jun 2012    [facility], [facility]    Medications:  Prescriptions prior to admission  Medication Sig Dispense Refill Last Dose  . amLODipine (NORVASC) 10 MG tablet Take 1 tablet (10 mg total) by mouth daily. 90 tablet 3 unknown at unknown  . aspirin 81 MG tablet Take 81 mg by mouth daily.   01/05/2016 at 0800  . atorvastatin (LIPITOR) 80 MG tablet Take 1 tablet (80 mg total) by mouth daily at 6 PM.  90 tablet 3 unknown at unknown  . cephALEXin (KEFLEX) 250 MG capsule Take 250 mg by mouth at bedtime.   unknown at unknown  . clopidogrel (PLAVIX) 75 MG tablet Take 75 mg by mouth daily.   01/04/2016 at pm  . clorazepate (TRANXENE) 7.5 MG tablet Take 1 tablet (7.5 mg total) by mouth daily. 90 tablet 3 unknown at unknown  . esomeprazole (NEXIUM) 40 MG capsule Take 40 mg by mouth daily.   unknown at unknown  . levothyroxine (SYNTHROID, LEVOTHROID) 50 MCG tablet Take 50 mcg by mouth daily before breakfast.   unknown at unknown  . lisinopril (PRINIVIL,ZESTRIL) 20 MG tablet Take 20 mg by mouth 2 (two) times daily.   unknown at unknown  . metoprolol (LOPRESSOR) 50 MG tablet Take 50 mg by mouth 2 (two) times daily.   unknown at unknown  . Multiple Vitamins-Minerals (CENTRUM SILVER PO) Take 1 tablet by mouth daily.    unknown at unknown  . vitamin C (ASCORBIC ACID) 500 MG tablet Take 500 mg by mouth daily.   unknown at unknown   Assessment: CrCl = 46.8 ml/min   Goal of Therapy:  resolution of infection  Plan:  Expected duration 7 days with resolution of temperature and/or normalization of WBC   Levaquin 500 mg PO daily originally ordered.  Will adjust dose to levaquin 500 mg PO X 1 for 4/21 followed by Levaquin 250 mg PO daily to start 4/22 @18 :00.  Brodee Mauritz D 01/07/2016,4:09 PM

## 2016-01-07 NOTE — Progress Notes (Signed)
Patient ID: Leah Preston, female   DOB: 01-01-1931, 82 y.o.   MRN: [id number] [facility] [place] [facility]   PATIENT NAME: Leah Preston    MR#:  [id number]  DATE OF BIRTH:  11-08-1930  SUBJECTIVE:  Confused Patient was brought in yesterday after family found her to be confused with significant leg pain after she had fell while riding in the RV on her check back to [place] from [place].  REVIEW OF SYSTEMS:   Review of Systems  Unable to perform ROS  DRUG ALLERGIES:   Allergies  Allergen Reactions  . Nitrofurantoin Monohyd Macro Other (See Comments)  . Oxycodone Hcl   . Tetanus Toxoids Swelling    VITALS:  Blood pressure 177/58, pulse 111, temperature 98.3 F (36.8 C), temperature source Oral, resp. rate 19, height 5\' 4"  (1.626 m), weight 62.234 kg (137 lb 3.2 oz), SpO2 92 %.  PHYSICAL EXAMINATION:   Physical Exam  GENERAL:  82 y.o.-year-old patient lying in the bed with no acute distress.  EYES: Pupils equal, round, reactive to light and accommodation. No scleral icterus. Extraocular muscles intact.  HEENT: Head atraumatic, normocephalic. Oropharynx and nasopharynx clear.  NECK:  Supple, no jugular venous distention. No thyroid enlargement, no tenderness.  LUNGS: Normal breath sounds bilaterally, no wheezing, rales, rhonchi. No use of accessory muscles of respiration.  CARDIOVASCULAR: S1, S2 normal. No murmurs, rubs, or gallops.  ABDOMEN: Soft, nontender, nondistended. Bowel sounds present. No organomegaly or mass.  EXTREMITIES: No cyanosis, clubbing or edema b/l.    NEUROLOGIC: Moves all extremities well. Patient is confused and unable to assess CNS exam in detail. Nonfocal grossly.  PSYCHIATRIC:  patient is alert and oriented x 3.  SKIN: No obvious rash, lesion, or ulcer.   LABORATORY PANEL:  CBC  Recent Labs Lab 01/07/16 0616  WBC 12.4*  HGB 8.8*  HCT 26.0*  PLT 185    Chemistries   Recent Labs Lab 01/07/2016 1126  01/07/16 0616  NA 131* 134*  K 4.4 4.6  CL 97* 100*  CO2 25 26  GLUCOSE 146* 129*  BUN 37* 28*  CREATININE 0.99 0.61  CALCIUM 9.4 9.4  AST 72*  --   ALT 51  --   ALKPHOS 77  --   BILITOT 0.8  --    Cardiac Enzymes  Recent Labs Lab 01/09/2016 1126  TROPONINI 0.07*   RADIOLOGY:  Ct Angio Head W/cm &/or Wo Cm  12/18/2015  ADDENDUM REPORT: 01/14/2016 14:15 ADDENDUM: Asymmetry of the tongue is noted with fatty changes in the left tongue. This is probably due to denervation. No mass lesion is seen in the skullbase. Electronically Signed   By: Marlan Palau M.D.   On: 01/05/2016 14:15  12/29/2015  CLINICAL DATA:  Stroke.  Altered mental status and confusion EXAM: CT ANGIOGRAPHY HEAD AND NECK TECHNIQUE: Multidetector CT imaging of the head and neck was performed using the standard protocol during bolus administration of intravenous contrast. Multiplanar CT image reconstructions and MIPs were obtained to evaluate the vascular anatomy. Carotid stenosis measurements (when applicable) are obtained utilizing NASCET criteria, using the distal internal carotid diameter as the denominator. CONTRAST:  75 mL Isovue 370 IV COMPARISON:  CT head 12/21/2015 FINDINGS: CTA NECK Aortic arch: Extensive atherosclerotic calcification in the aortic arch without aneurysm or dissection. Advanced atherosclerotic calcification in the proximal great vessels. Atherosclerotic calcification throughout the subclavian arteries bilaterally. Right upper lobe infiltrate. This may represent neoplasm or infection. Follow-up with chest x-ray and chest  CT recommended. Right apical pleural-based irregular thickening likely due to scarring. Left apical scarring also noted. Right carotid system: Atherosclerotic calcification throughout the right common carotid artery with less than 50% diameter stenosis in multiple areas. Calcified and noncalcified plaque in the right carotid bifurcation. 40% diameter stenosis proximal right internal  carotid artery. 75% stenosis origin right external carotid artery. Findings consistent with fibromuscular dysplasia in the cervical carotid without aneurysm or dissection. Left carotid system: Advanced atherosclerotic disease in the common carotid artery with multiple areas of less than 50% diameter stenosis. Noncalcified plaque in the left common carotid artery without significant stenosis on the left. Changes of fibromuscular dysplasia in the left internal carotid artery without dissection or aneurysm. Vertebral arteries:Both vertebral arteries patent to the basilar. Mild atherosclerotic disease at the origin of both vertebral arteries. Mild stenosis in the mid left vertebral artery. Atherosclerotic calcification and moderate stenosis the distal vertebral artery bile Skeleton: Moderate cervical disc and a facet degeneration. Mild compression fracture T1 and moderate to severe compression fracture T3. These are of indeterminate age. Other neck: Negative for adenopathy in the neck. CTA HEAD Anterior circulation: Advanced atherosclerotic calcification in the cavernous carotid bilaterally with moderate to severe stenosis bilaterally in the cavernous segment. No aneurysm. Anterior and middle cerebral arteries patent bilaterally. Moderate disease in the A2 segment bilaterally. Mild disease in the middle cerebral artery branches bilaterally without segmental occlusion or critical stenosis. Posterior circulation: Atherosclerotic calcification distal vertebral artery bilaterally causing moderate stenosis. PICA patent bilaterally. Basilar patent with mild atherosclerotic plaque. Atherosclerotic disease with moderate stenosis in the posterior cerebral artery bilaterally. Negative for cerebral aneurysm Venous sinuses: Patent Anatomic variants: None Delayed phase: Normal enhancement on delayed imaging. Atrophy and moderate chronic microvascular ischemic changes in the white matter. No acute cortical infarct. IMPRESSION: Right  upper lobe infiltrate which may represent neoplasm or infection. Apical lung densities bilaterally most consistent with scarring. Follow-up chest x-ray and chest CT recommended for further evaluation. Severe atherosclerotic disease with heavily calcified arteries in the aortic arch and both carotid and vertebral arteries diffusely. Extensive plaque in the right common carotid artery 40% diameter stenosis proximal right internal carotid artery and 75% diameter stenosis origin right external carotid artery. Fibromuscular dysplasia cervical carotid without aneurysm or dissection Atherosclerotic plaque in the left carotid with less than 50% diameter stenosis of the common carotid artery on the left. No significant internal carotid artery stenosis. There is fibromuscular dysplasia in the left internal carotid artery. Moderate disease in distal vertebral artery bilaterally. Moderate stenosis of the posterior cerebral artery bilaterally. Heavily calcified plaque in the cavernous carotid bilaterally with moderate to severe stenosis bilaterally atherosclerotic irregularity and mild stenosis of middle cerebral artery branches bilaterally. Fractures of T1 and T3 indeterminate age Electronically Signed: By: Marlan Palau M.D. On: 01/11/16 14:00   Dg Chest 2 View  January 11, 2016  CLINICAL DATA:  Recent fall.  Pain.  Initial encounter. EXAM: CHEST  2 VIEW COMPARISON:  Single view of the chest 03/15/2011. FINDINGS: The lungs are clear. There is cardiomegaly. No pneumothorax or pleural effusion. Aortic atherosclerosis is seen. Degenerative change is present about the shoulders. IMPRESSION: No acute disease. Electronically Signed   By: Drusilla Kanner M.D.   On: 01/11/2016 17:29   Ct Head Wo Contrast  01/11/16  CLINICAL DATA:  Larey Seat 3 days ago, altered mental status with difficulty speaking today, patient is on blood thinners EXAM: CT HEAD WITHOUT CONTRAST TECHNIQUE: Contiguous axial images were obtained from the base of the  skull  through the vertex without intravenous contrast. COMPARISON:  02/23/2015 FINDINGS: No hemorrhage or extra-axial fluid. Diffuse cortical atrophy with advanced low attenuation in the deep white matter. No evidence of vascular territory infarct or mass. No hydrocephalus. There is no hemorrhage or extra-axial fluid. No skull fracture is identified. The visualized portions of the paranasal sinuses are clear. IMPRESSION: Age-related involutional change with no acute intracranial abnormalities. Critical Value/emergent results were called by telephone at the time of interpretation on 12/25/2015 at 11:38 am to Dr. Phineas SemenGRAYDON GOODMAN , who verbally acknowledged these results. Electronically Signed   By: Esperanza Heiraymond  Rubner M.D.   On: 01/12/2016 11:38   Ct Angio Neck W/cm &/or Wo/cm  01/07/2016  ADDENDUM REPORT: 01/13/2016 14:15 ADDENDUM: Asymmetry of the tongue is noted with fatty changes in the left tongue. This is probably due to denervation. No mass lesion is seen in the skullbase. Electronically Signed   By: Marlan Palauharles  Clark M.D.   On: 12/26/2015 14:15  12/25/2015  CLINICAL DATA:  Stroke.  Altered mental status and confusion EXAM: CT ANGIOGRAPHY HEAD AND NECK TECHNIQUE: Multidetector CT imaging of the head and neck was performed using the standard protocol during bolus administration of intravenous contrast. Multiplanar CT image reconstructions and MIPs were obtained to evaluate the vascular anatomy. Carotid stenosis measurements (when applicable) are obtained utilizing NASCET criteria, using the distal internal carotid diameter as the denominator. CONTRAST:  75 mL Isovue 370 IV COMPARISON:  CT head 01/09/2016 FINDINGS: CTA NECK Aortic arch: Extensive atherosclerotic calcification in the aortic arch without aneurysm or dissection. Advanced atherosclerotic calcification in the proximal great vessels. Atherosclerotic calcification throughout the subclavian arteries bilaterally. Right upper lobe infiltrate. This may  represent neoplasm or infection. Follow-up with chest x-ray and chest CT recommended. Right apical pleural-based irregular thickening likely due to scarring. Left apical scarring also noted. Right carotid system: Atherosclerotic calcification throughout the right common carotid artery with less than 50% diameter stenosis in multiple areas. Calcified and noncalcified plaque in the right carotid bifurcation. 40% diameter stenosis proximal right internal carotid artery. 75% stenosis origin right external carotid artery. Findings consistent with fibromuscular dysplasia in the cervical carotid without aneurysm or dissection. Left carotid system: Advanced atherosclerotic disease in the common carotid artery with multiple areas of less than 50% diameter stenosis. Noncalcified plaque in the left common carotid artery without significant stenosis on the left. Changes of fibromuscular dysplasia in the left internal carotid artery without dissection or aneurysm. Vertebral arteries:Both vertebral arteries patent to the basilar. Mild atherosclerotic disease at the origin of both vertebral arteries. Mild stenosis in the mid left vertebral artery. Atherosclerotic calcification and moderate stenosis the distal vertebral artery bile Skeleton: Moderate cervical disc and a facet degeneration. Mild compression fracture T1 and moderate to severe compression fracture T3. These are of indeterminate age. Other neck: Negative for adenopathy in the neck. CTA HEAD Anterior circulation: Advanced atherosclerotic calcification in the cavernous carotid bilaterally with moderate to severe stenosis bilaterally in the cavernous segment. No aneurysm. Anterior and middle cerebral arteries patent bilaterally. Moderate disease in the A2 segment bilaterally. Mild disease in the middle cerebral artery branches bilaterally without segmental occlusion or critical stenosis. Posterior circulation: Atherosclerotic calcification distal vertebral artery  bilaterally causing moderate stenosis. PICA patent bilaterally. Basilar patent with mild atherosclerotic plaque. Atherosclerotic disease with moderate stenosis in the posterior cerebral artery bilaterally. Negative for cerebral aneurysm Venous sinuses: Patent Anatomic variants: None Delayed phase: Normal enhancement on delayed imaging. Atrophy and moderate chronic microvascular ischemic changes in the white matter. No  acute cortical infarct. IMPRESSION: Right upper lobe infiltrate which may represent neoplasm or infection. Apical lung densities bilaterally most consistent with scarring. Follow-up chest x-ray and chest CT recommended for further evaluation. Severe atherosclerotic disease with heavily calcified arteries in the aortic arch and both carotid and vertebral arteries diffusely. Extensive plaque in the right common carotid artery 40% diameter stenosis proximal right internal carotid artery and 75% diameter stenosis origin right external carotid artery. Fibromuscular dysplasia cervical carotid without aneurysm or dissection Atherosclerotic plaque in the left carotid with less than 50% diameter stenosis of the common carotid artery on the left. No significant internal carotid artery stenosis. There is fibromuscular dysplasia in the left internal carotid artery. Moderate disease in distal vertebral artery bilaterally. Moderate stenosis of the posterior cerebral artery bilaterally. Heavily calcified plaque in the cavernous carotid bilaterally with moderate to severe stenosis bilaterally atherosclerotic irregularity and mild stenosis of middle cerebral artery branches bilaterally. Fractures of T1 and T3 indeterminate age Electronically Signed: By: Marlan Palau M.D. On: 13-Jan-2016 14:00   Dg Hip Unilat With Pelvis 2-3 Views Left  01-13-2016  CLINICAL DATA:  Recent fall with bilateral hip pain, initial encounter EXAM: DG HIP (WITH OR WITHOUT PELVIS) 2-3V LEFT COMPARISON:  None. FINDINGS: Pelvic ring is intact.  No acute fracture or dislocation is seen. Bilateral iliac stenting is noted. Contrast material is noted within the bladder consistent with the recent CT angiography. IMPRESSION: No acute abnormality noted. Electronically Signed   By: Alcide Clever M.D.   On: 01-13-2016 17:23   Dg Hip Unilat With Pelvis 2-3 Views Right  01/13/2016  CLINICAL DATA:  Recent fall with right hip pain, initial encounter EXAM: DG HIP (WITH OR WITHOUT PELVIS) 2-3V RIGHT COMPARISON:  None. FINDINGS: There is no evidence of hip fracture or dislocation. There is no evidence of arthropathy or other focal bone abnormality. IMPRESSION: No acute abnormality noted. Electronically Signed   By: Alcide Clever M.D.   On: 01/13/16 17:24   ASSESSMENT AND PLAN:  * Confusion  This may be multifactorial, most likely metabolic encephalopathy secondary to pain medication use and stress,  CT of the neck also reports some possible infiltrate on the upper lobe of the lungs, and white cell count is elevated.  start po levaquin emprically  Urinalysis is negative for bleeding and white blood cells.  Also get an MRI of the brain to rule out for any stroke as she has significant history.  She was seen by neurologist Dr. Thad Ranger in ER.  * Pelvic pain  She had a fall yesterday.  no pelvic fracture   Pain management as needed. Pt has T1 and the 3 compression fracture. Orthopedic consultation placed.  * Anemia most likely due to blood loss  We'll check stool guaiac and act accordingly, currently no need for transfusion.  * Coronary artery disease status post stent  Continue cardiac medications aspirin and Plavix at this point.  * Hypertension  Continue home medications.  * Hyperlipidemia  Continue statin.  Case discussed with Care Management/Social Worker. Management plans discussed with the patient, family and they are in agreement.  CODE STATUS: full  DVT Prophylaxis: lovenox  TOTAL TIME TAKING CARE OF THIS  PATIENT: 30 minutes.  >50% time spent on counselling and coordination of care  POSSIBLE D/C IN 2 DAYS, DEPENDING ON CLINICAL CONDITION.  Note: This dictation was prepared with Dragon dictation along with smaller phrase technology. Any transcriptional errors that result from this process are unintentional.  Emmagrace Runkel M.D on 01/07/2016  at 2:51 PM  Between 7am to 6pm - Pager - [phone]  After 6pm go to [email or web address] - password [email or web address] [facility]  [facility]  Office  [phone]  CC: Primary care physician; Sherlene Shams, MD

## 2016-01-07 NOTE — Progress Notes (Signed)
Notified Dr. Allena KatzPatel of MRI results and that patient is having leg pain from fall but only has tylenol ordered. Ultram 50mg  q6h prn pain. Telephone.

## 2016-01-08 ENCOUNTER — Inpatient Hospital Stay: Payer: Medicare Other

## 2016-01-08 ENCOUNTER — Observation Stay: Admit: 2016-01-08 | Payer: Medicare Other

## 2016-01-08 DIAGNOSIS — R102 Pelvic and perineal pain: Secondary | ICD-10-CM | POA: Diagnosis present

## 2016-01-08 DIAGNOSIS — R0602 Shortness of breath: Secondary | ICD-10-CM | POA: Diagnosis not present

## 2016-01-08 DIAGNOSIS — J9690 Respiratory failure, unspecified, unspecified whether with hypoxia or hypercapnia: Secondary | ICD-10-CM | POA: Diagnosis present

## 2016-01-08 DIAGNOSIS — Z955 Presence of coronary angioplasty implant and graft: Secondary | ICD-10-CM | POA: Diagnosis not present

## 2016-01-08 DIAGNOSIS — Z9841 Cataract extraction status, right eye: Secondary | ICD-10-CM | POA: Diagnosis not present

## 2016-01-08 DIAGNOSIS — J9601 Acute respiratory failure with hypoxia: Secondary | ICD-10-CM | POA: Diagnosis not present

## 2016-01-08 DIAGNOSIS — Z96652 Presence of left artificial knee joint: Secondary | ICD-10-CM | POA: Diagnosis present

## 2016-01-08 DIAGNOSIS — R410 Disorientation, unspecified: Secondary | ICD-10-CM

## 2016-01-08 DIAGNOSIS — I469 Cardiac arrest, cause unspecified: Secondary | ICD-10-CM

## 2016-01-08 DIAGNOSIS — J690 Pneumonitis due to inhalation of food and vomit: Secondary | ICD-10-CM | POA: Diagnosis not present

## 2016-01-08 DIAGNOSIS — Z7982 Long term (current) use of aspirin: Secondary | ICD-10-CM | POA: Diagnosis not present

## 2016-01-08 DIAGNOSIS — Z90710 Acquired absence of both cervix and uterus: Secondary | ICD-10-CM | POA: Diagnosis not present

## 2016-01-08 DIAGNOSIS — I2510 Atherosclerotic heart disease of native coronary artery without angina pectoris: Secondary | ICD-10-CM | POA: Diagnosis present

## 2016-01-08 DIAGNOSIS — W19XXXA Unspecified fall, initial encounter: Secondary | ICD-10-CM | POA: Diagnosis present

## 2016-01-08 DIAGNOSIS — Z9842 Cataract extraction status, left eye: Secondary | ICD-10-CM | POA: Diagnosis not present

## 2016-01-08 DIAGNOSIS — Z452 Encounter for adjustment and management of vascular access device: Secondary | ICD-10-CM | POA: Insufficient documentation

## 2016-01-08 DIAGNOSIS — S22019A Unspecified fracture of first thoracic vertebra, initial encounter for closed fracture: Secondary | ICD-10-CM | POA: Diagnosis present

## 2016-01-08 DIAGNOSIS — R531 Weakness: Secondary | ICD-10-CM | POA: Diagnosis present

## 2016-01-08 DIAGNOSIS — Z888 Allergy status to other drugs, medicaments and biological substances status: Secondary | ICD-10-CM | POA: Diagnosis not present

## 2016-01-08 DIAGNOSIS — S22039A Unspecified fracture of third thoracic vertebra, initial encounter for closed fracture: Secondary | ICD-10-CM | POA: Diagnosis present

## 2016-01-08 DIAGNOSIS — I10 Essential (primary) hypertension: Secondary | ICD-10-CM | POA: Diagnosis present

## 2016-01-08 DIAGNOSIS — I639 Cerebral infarction, unspecified: Principal | ICD-10-CM | POA: Insufficient documentation

## 2016-01-08 DIAGNOSIS — Z9889 Other specified postprocedural states: Secondary | ICD-10-CM | POA: Diagnosis not present

## 2016-01-08 DIAGNOSIS — Z79899 Other long term (current) drug therapy: Secondary | ICD-10-CM | POA: Diagnosis not present

## 2016-01-08 DIAGNOSIS — E785 Hyperlipidemia, unspecified: Secondary | ICD-10-CM | POA: Diagnosis present

## 2016-01-08 DIAGNOSIS — Z87891 Personal history of nicotine dependence: Secondary | ICD-10-CM | POA: Diagnosis not present

## 2016-01-08 DIAGNOSIS — Z823 Family history of stroke: Secondary | ICD-10-CM | POA: Diagnosis not present

## 2016-01-08 DIAGNOSIS — Z885 Allergy status to narcotic agent status: Secondary | ICD-10-CM | POA: Diagnosis not present

## 2016-01-08 DIAGNOSIS — I739 Peripheral vascular disease, unspecified: Secondary | ICD-10-CM | POA: Diagnosis present

## 2016-01-08 LAB — CBC WITH DIFFERENTIAL/PLATELET
BASOS ABS: 0 10*3/uL (ref 0–0.1)
BASOS PCT: 0 %
Band Neutrophils: 19 %
Blasts: 0 %
EOS PCT: 0 %
Eosinophils Absolute: 0 10*3/uL (ref 0–0.7)
HCT: 27.6 % — ABNORMAL LOW (ref 35.0–47.0)
HEMOGLOBIN: 8.9 g/dL — AB (ref 12.0–16.0)
LYMPHS ABS: 6.5 10*3/uL — AB (ref 1.0–3.6)
Lymphocytes Relative: 45 %
MCH: 31.3 pg (ref 26.0–34.0)
MCHC: 32.2 g/dL (ref 32.0–36.0)
MCV: 97.2 fL (ref 80.0–100.0)
METAMYELOCYTES PCT: 3 %
MONO ABS: 0.4 10*3/uL (ref 0.2–0.9)
MONOS PCT: 3 %
Myelocytes: 2 %
NEUTROS ABS: 7.6 10*3/uL — AB (ref 1.4–6.5)
Neutrophils Relative %: 28 %
Other: 0 %
PLATELETS: 261 10*3/uL (ref 150–440)
Promyelocytes Absolute: 0 %
RBC: 2.84 MIL/uL — ABNORMAL LOW (ref 3.80–5.20)
RDW: 15.9 % — ABNORMAL HIGH (ref 11.5–14.5)
WBC: 14.5 10*3/uL — ABNORMAL HIGH (ref 3.6–11.0)
nRBC: 1 /100 WBC — ABNORMAL HIGH

## 2016-01-08 LAB — BLOOD GAS, ARTERIAL
ALLENS TEST (PASS/FAIL): POSITIVE — AB
Acid-base deficit: 8.5 mmol/L — ABNORMAL HIGH (ref 0.0–2.0)
Bicarbonate: 17.3 mEq/L — ABNORMAL LOW (ref 21.0–28.0)
FIO2: 100
O2 SAT: 98.9 %
PATIENT TEMPERATURE: 37
PO2 ART: 142 mmHg — AB (ref 83.0–108.0)
pCO2 arterial: 36 mmHg (ref 32.0–48.0)
pH, Arterial: 7.29 — ABNORMAL LOW (ref 7.350–7.450)

## 2016-01-08 LAB — COMPREHENSIVE METABOLIC PANEL
ALBUMIN: 2.7 g/dL — AB (ref 3.5–5.0)
ALT: 1169 U/L — AB (ref 14–54)
ANION GAP: 33 — AB (ref 5–15)
AST: 1459 U/L — ABNORMAL HIGH (ref 15–41)
Alkaline Phosphatase: 111 U/L (ref 38–126)
BUN: 42 mg/dL — ABNORMAL HIGH (ref 6–20)
CALCIUM: 9.9 mg/dL (ref 8.9–10.3)
CHLORIDE: 89 mmol/L — AB (ref 101–111)
CO2: 19 mmol/L — AB (ref 22–32)
Creatinine, Ser: 1.99 mg/dL — ABNORMAL HIGH (ref 0.44–1.00)
GFR calc non Af Amer: 22 mL/min — ABNORMAL LOW (ref >60)
GFR, EST AFRICAN AMERICAN: 26 mL/min — AB (ref >60)
GLUCOSE: 206 mg/dL — AB (ref 65–99)
Potassium: 4.3 mmol/L (ref 3.5–5.1)
Sodium: 141 mmol/L (ref 135–145)
Total Bilirubin: 1.1 mg/dL (ref 0.3–1.2)
Total Protein: 6.9 g/dL (ref 6.5–8.1)

## 2016-01-08 LAB — GLUCOSE, CAPILLARY
Glucose-Capillary: 131 mg/dL — ABNORMAL HIGH (ref 65–99)
Glucose-Capillary: 211 mg/dL — ABNORMAL HIGH (ref 65–99)

## 2016-01-08 LAB — PHOSPHORUS: PHOSPHORUS: 7.1 mg/dL — AB (ref 2.5–4.6)

## 2016-01-08 LAB — MRSA PCR SCREENING: MRSA BY PCR: NEGATIVE

## 2016-01-08 LAB — MAGNESIUM: Magnesium: 3.3 mg/dL — ABNORMAL HIGH (ref 1.7–2.4)

## 2016-01-08 LAB — TROPONIN I: TROPONIN I: 0.07 ng/mL — AB (ref <0.031)

## 2016-01-08 MED ORDER — ACETAMINOPHEN 325 MG PO TABS: 650.0000 mg | ORAL_TABLET | ORAL | Status: DC | PRN

## 2016-01-08 MED ORDER — NOREPINEPHRINE 4 MG/250ML-% IV SOLN
0.0000 ug/min | INTRAVENOUS | Status: DC
Start: 2016-01-08 — End: 2016-01-08

## 2016-01-08 MED ORDER — SODIUM CHLORIDE 0.9 % IV SOLN: 250.0000 mL | INTRAVENOUS | Status: DC | PRN

## 2016-01-08 MED ORDER — SODIUM CHLORIDE 0.9% FLUSH: 3.0000 mL | Freq: Two times a day (BID) | INTRAVENOUS | Status: DC

## 2016-01-08 MED ORDER — SODIUM CHLORIDE 0.9% FLUSH: 3.0000 mL | INTRAVENOUS | Status: DC | PRN

## 2016-01-08 MED ORDER — NOREPINEPHRINE 4 MG/250ML-% IV SOLN
INTRAVENOUS | Status: AC
  Filled 2016-01-08: qty 250

## 2016-01-08 MED ORDER — PIPERACILLIN-TAZOBACTAM 3.375 G IVPB
3.3750 g | Freq: Three times a day (TID) | INTRAVENOUS | Status: DC
Start: 2016-01-08 — End: 2016-01-08
  Filled 2016-01-08 (×3): qty 50

## 2016-01-08 MED ORDER — NOREPINEPHRINE BITARTRATE 1 MG/ML IV SOLN
4.0000 ug/min | INTRAVENOUS | Status: DC
  Filled 2016-01-08: qty 4

## 2016-01-08 MED ORDER — PANTOPRAZOLE SODIUM 40 MG IV SOLR: 40.0000 mg | Freq: Every day | INTRAVENOUS | Status: DC

## 2016-01-08 MED ORDER — ENOXAPARIN SODIUM 40 MG/0.4ML ~~LOC~~ SOLN: 40.0000 mg | SUBCUTANEOUS | Status: DC

## 2016-01-08 MED ORDER — SODIUM CHLORIDE 0.9 % IV BOLUS (SEPSIS)
1000.0000 mL | Freq: Once | INTRAVENOUS | Status: AC
  Administered 2016-01-08: 1000 mL via INTRAVENOUS

## 2016-01-08 MED ORDER — MIDAZOLAM HCL 2 MG/2ML IJ SOLN
INTRAMUSCULAR | Status: AC
  Administered 2016-01-08: 2 mg
  Filled 2016-01-08: qty 2

## 2016-01-08 MED ORDER — ONDANSETRON HCL 4 MG/2ML IJ SOLN: 4.0000 mg | Freq: Four times a day (QID) | INTRAMUSCULAR | Status: DC | PRN

## 2016-01-08 MED ORDER — SODIUM CHLORIDE 0.9 % IV SOLN
INTRAVENOUS | Status: DC
  Administered 2016-01-08: 10:00:00 via INTRAVENOUS

## 2016-01-08 MED ORDER — FAMOTIDINE IN NACL 20-0.9 MG/50ML-% IV SOLN
20.0000 mg | Freq: Two times a day (BID) | INTRAVENOUS | Status: DC
  Filled 2016-01-08 (×2): qty 50

## 2016-01-09 MED FILL — Medication: Qty: 1 | Status: AC

## 2016-01-17 NOTE — Progress Notes (Signed)
Initial Nutrition Assessment  DOCUMENTATION CODES:   Not applicable  INTERVENTION:   -EN: Pt just intubated this morning, currently has OG tube hooked to low intermittent suction with coffee ground output. Per MD, Kasa agreeable to hold EN at this time and reassess. Recommend initiation of EN as soon as medically able. Will continue to assess. -Will discontinue Ensure supplement order at this time   NUTRITION DIAGNOSIS:   Inadequate oral intake related to acute illness, inability to eat as evidenced by NPO status.  GOAL:   Provide needs based on ASPEN/SCCM guidelines  MONITOR:   Vent status, Labs, Weight trends, Skin, I & O's  REASON FOR ASSESSMENT:   Consult Assessment of nutrition requirement/status  ASSESSMENT:   Pt admitted with confusion and s/p fall in AV during travel [place]. Pt s/p Code Blue 4/22 after projectile vomitting and pt became unresponisve, subsequently became intubated on the vent.  Past Medical History  Diagnosis Date  . Peripheral vascular disease (HCC)     s/p carotid endarterectomy  . History of bladder problems   . Hypertension   . Hyperlipidemia   . OA (osteoarthritis of spine)     with chronic pain  . coronary artery disease   . subdurah/subarachnoid hemorrhage jun 2012    [facility]    Diet Order:  Diet NPO time specified    Pt recently intubated this am, transferred to ICU this am. Per documentation pt ate 100% of breakfast and 15% of dinner yesterday with 0% documented at lunch. Per MST no decrease in appetite PTA. No family present on visit. RD notes pt recently on a trip to [place] per MD note.   Medications: MVI, Protonix, vitamin C, NS at 15100mL/hr, Levophed ordered Labs: reviewed Na 134, Glucose 129 this am   Gastrointestinal Profile: Last BM:  12/28/2015   Nutrition-Focused Physical Exam Findings:  Unable to complete Nutrition-Focused physical exam at this time.     Weight Change: Per  MST pt weight stable PTA. Per [facility] weight encounters pt weight increased.   Skin:  Reviewed, no issues   Height:   Ht Readings from Last 1 Encounters:  01/07/2016 5\' 4"  (1.626 m)    Weight:   Wt Readings from Last 1 Encounters:  01/13/2016 137 lb 3.2 oz (62.234 kg)   Wt Readings from Last 10 Encounters:  01/14/2016 137 lb 3.2 oz (62.234 kg)  06/23/15 130 lb 4 oz (59.081 kg)  05/10/15 130 lb (58.968 kg)  05/03/15 130 lb 12 oz (59.308 kg)  04/27/15 128 lb (58.06 kg)  02/23/15 130 lb (58.968 kg)  01/28/15 131 lb (59.421 kg)  05/20/14 127 lb 4 oz (57.72 kg)  03/31/14 125 lb 12 oz (57.04 kg)  02/10/14 128 lb (58.06 kg)    BMI:  Body mass index is 23.54 kg/(m^2).  Estimated Nutritional Needs:   Kcal:  will assess once on vent greater than 24 hours using [employer] Equation per ASPEN guidelines  Protein:  124-155g protein (2.0-2.5g protein)  Fluid:  >1.6L fluid  EDUCATION NEEDS:   No education needs identified at this time  Leda QuailAllyson Henchy Mccauley, RD, LDN Pager [phone] Weekend/On-Call Pager [phone]

## 2016-01-17 NOTE — Procedures (Signed)
Central Venous Catheter Placement: Indication: Patient receiving vesicant or irritant drug.; Patient receiving intravenous therapy for longer than 5 days.; Patient has limited or no vascular access.   Consent:verbal/written  Risks and benefits explained in detail including risk of infection, bleeding, respiratory failure and death..   Hand washing performed prior to starting the procedure.   Procedure: An active timeout was performed and correct patient, name, & ID confirmed.  After explaining risk and benefits, patient was positioned correctly for central venous access. Patient was prepped using strict sterile technique including chlorohexadine preps, sterile drape, sterile gown and sterile gloves.  The area was prepped, draped and anesthetized in the usual sterile manner. Patient comfort was obtained.  A triple lumen catheter was placed in RT Internal Jugular Vein There was good blood return, catheter caps were placed on lumens, catheter flushed easily, the line was secured and a sterile dressing and BIO-PATCH applied.   Ultrasound was used to visualize vasculature and guidance of needle.   Number of Attempts: 1 Complications:none Estimated Blood Loss: none Chest Radiograph indicated and ordered.  Operator: Leah Preston.   Leah Preston, M.D.  [facility]  Medical Director [facility] Medical Director [facility] Cardio[facility]Pulmonary [facility]

## 2016-01-17 NOTE — Progress Notes (Signed)
Patient went into PEA- Code Blue was initiated- Dr. Belia HemanKasa at bedside.  Family was present and asked us to stop chest compressions.  Time of death was called at 11:17.

## 2016-01-17 NOTE — Discharge Summary (Signed)
  [facility] Pulmonary Medicine Consultation     DEATH SUMMARY   CHIEF COMPLAINT:  Acute resp failure and cardiac arrest   HISTORY OF PRESENT ILLNESS  82 yo white female admitted for confusion, code blue called this AM, evidence of acute aspiration of gastric ocntents CPR started approx 10 mins, intubated, transferring to ICU\ Admitted 4/20 for acute CVA, confusion, patient with distended abd starting vomiting, excessive amounts of gastric secretios suctioned from mouth and airways   SIGNIFICANT EVENTS  4/22 cardiac arrest intubated  ROS      Physical Exam     Patient underwent second cardiac arrest, family witnessed, and they have asked and instructed  us to stop CPR and chest compressions Patient was pronounced dead. Family at bedside    ASSESSMENT/PLAN  82 yo white female with acute CVA with acute cardiac arrest from acute aspiration of gastric contents, recurrent cardiac arrest Time of death:1117AM Family satisfied with plan of action and management   Luz Mares Santiago Gladavid Oseas Detty, M.D.  [facility]  Medical Director [facility] Medical Director [facility] Cardio-Pulmonary Department

## 2016-01-17 NOTE — Progress Notes (Signed)
Code blue called to room 111.  RT responded, found patient to have aspirated and unresponsive.  Dr. Mayford KnifeWilliams intubated patient with 7.5 ETT at 23 at lip on first attempt.  Tube secured and patient transported to ICU 13 without complication.  Patient placed on servo i vent upon arrival to ICU 13.

## 2016-01-17 NOTE — ED Provider Notes (Deleted)
[facility]  Department of Emergency Medicine   Code Blue CONSULT NOTE  Chief Complaint: Cardiac arrest/unresponsive   Level V Caveat: Unresponsive  History of present illness: I was contacted by the hospital for a CODE BLUE cardiac arrest and presented to the patient's bedside. Room was 111  ROS: Unable to obtain, Level V caveat  Scheduled Meds: . amitriptyline  50 mg Oral Daily  . amLODipine  10 mg Oral Daily  . aspirin EC  81 mg Oral Daily  . atorvastatin  80 mg Oral q1800  . clorazepate  7.5 mg Oral Daily  . enoxaparin (LOVENOX) injection  30 mg Subcutaneous Q24H  . famotidine (PEPCID) IV  20 mg Intravenous Q12H  . feeding supplement (ENSURE ENLIVE)  237 mL Oral BID BM  . levofloxacin  250 mg Oral Daily  . levothyroxine  50 mcg Oral QAC breakfast  . lisinopril  20 mg Oral BID  . meloxicam  15 mg Oral Daily  . metoprolol  50 mg Oral BID  . multivitamin with minerals  1 tablet Oral Daily  . pantoprazole  40 mg Oral Daily  . pantoprazole (PROTONIX) IV  40 mg Intravenous QHS  . sodium chloride flush  3 mL Intravenous Q12H  . sodium chloride flush  3 mL Intravenous Q12H  . vitamin C  500 mg Oral Daily   Continuous Infusions:  PRN Meds:.sodium chloride, sodium chloride, acetaminophen, alum & mag hydroxide-simeth, ondansetron (ZOFRAN) IV, sodium chloride flush, traMADol Past Medical History  Diagnosis Date  . Peripheral vascular disease (HCC)     s/p carotid endarterectomy  . History of bladder problems   . Hypertension   . Hyperlipidemia   . OA (osteoarthritis of spine)     with chronic pain  . coronary artery disease   . subdurah/subarachnoid hemorrhage jun 2012    [facility]   Past Surgical History  Procedure Laterality Date  . Cesarean section    . Carotid endarterectomy  jan 2012    left, complicated by large hematoma, evacuated  . Fracture surgery  2006    Left radial pinning  . Joint replacement      Left TKR 2010, [facility]   . Bladder suspension  2003    [facility]  . Cataract extraction, bilateral  August 2013  . Abdominal hysterectomy  1968    menorrhagia  . Peripheral vascular catheterization Left 04/27/2015    Procedure: Lower Extremity Angiography;  Surgeon: Renford DillsGregory G Schnier, MD;  Location: [facility] INVASIVE CV LAB;  Service: Cardiovascular;  Laterality: Left;  . Peripheral vascular catheterization Left 04/27/2015    Procedure: Lower Extremity Intervention;  Surgeon: Renford DillsGregory G Schnier, MD;  Location: [facility] INVASIVE CV LAB;  Service: Cardiovascular;  Laterality: Left;   Social History   Social History  . Marital Status: Widowed    Spouse Name: N/A  . Number of Children: N/A  . Years of Education: N/A   Occupational History  . Not on file.   Social History Main Topics  . Smoking status: Former Smoker    Quit date: 06/07/1995  . Smokeless tobacco: Never Used  . Alcohol Use: No  . Drug Use: No  . Sexual Activity: Not on file   Other Topics Concern  . Not on file   Social History Narrative   Lives alone after divorce in [id number]. Daughter in unemployed and living with her since August 2008 (and granddaughter, 1520).      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 Nitrofurantoin Monohyd Macro Other (See Comments)  . Oxycodone Hcl   . Tetanus Toxoids Swelling    Last set of Vital Signs (not current) Filed Vitals:   01/07/2016 0749 12/31/2015 0755  BP: 118/58 118/58  Pulse: 123 138  Temp: 99.4 F (37.4 C)   Resp:        Physical Exam  Gen: unresponsive Cardiovascular: pulseless  Resp: apneic. Coarse breath sounds, copious vomitus coming from her mouth Abd: nondistended  Neuro: GCS 3, unresponsive to pain  HEENT: Copious vomitus coming out of her mouth and possible aspiration Musculoskeletal: No deformity  Skin: warm  Procedures  INTUBATION Performed by: Daryel November E Required items: required blood  products, implants, devices, and special equipment available Patient identity confirmed: provided demographic data and hospital-assigned identification number Time out: Immediately prior to procedure a "time out" was called to verify the correct patient, procedure, equipment, support staff and site/side marked as required. Indications: Unresponsive, airway protection, active vomiting  Intubation method: Glasgow Preoxygenation: 100% BVM Sedatives: No  Paralytic: No  Tube Size: 7.5 cuffed Post-procedure assessment: chest rise and ETCO2 monitor Breath sounds: equal and absent over the epigastrium Tube secured by Respiratory Therapy Patient tolerated the procedure well with no immediate complications.   Cardiopulmonary Resuscitation (CPR) Procedure Note  Directed/Performed by: Emily Filbert I personally directed ancillary staff and/or performed CPR in an effort to regain return of spontaneous circulation and to maintain cardiac, neuro and systemic perfusion.    Medical Decision making  Patient was resuscitated, airway was placed by me. Was a difficult airway due to the copious vomitus and faulty laryngoscope. I scope was used to intubate the patient. She had return of spontaneous circulation, he had been given 3 mg of epinephrine and chest compressions prior to return of circulation.   Assessment and Plan  Cardiopulmonary arrest, likely aspiration due to vomiting Patient care was transferred to Dr. Belia Heman in the ICU.  Emily Filbert, MD 01/06/2016 1610  Emily Filbert, MD 01/11/2016 [phone]

## 2016-01-17 NOTE — ED Provider Notes (Signed)
[facility]  Department of Emergency Medicine   Code Blue CONSULT NOTE  Chief Complaint: Cardiac arrest/unresponsive   Level V Caveat: Unresponsive  History of present illness: I was contacted by the hospital for a CODE BLUE cardiac arrest and presented to the patient's bedside. Room was 111  ROS: Unable to obtain, Level V caveat  Scheduled Meds: . amitriptyline  50 mg Oral Daily  . amLODipine  10 mg Oral Daily  . aspirin EC  81 mg Oral Daily  . atorvastatin  80 mg Oral q1800  . clorazepate  7.5 mg Oral Daily  . enoxaparin (LOVENOX) injection  30 mg Subcutaneous Q24H  . famotidine (PEPCID) IV  20 mg Intravenous Q12H  . feeding supplement (ENSURE ENLIVE)  237 mL Oral BID BM  . levofloxacin  250 mg Oral Daily  . levothyroxine  50 mcg Oral QAC breakfast  . lisinopril  20 mg Oral BID  . meloxicam  15 mg Oral Daily  . metoprolol  50 mg Oral BID  . multivitamin with minerals  1 tablet Oral Daily  . pantoprazole  40 mg Oral Daily  . pantoprazole (PROTONIX) IV  40 mg Intravenous QHS  . sodium chloride flush  3 mL Intravenous Q12H  . sodium chloride flush  3 mL Intravenous Q12H  . vitamin C  500 mg Oral Daily   Continuous Infusions:  PRN Meds:.sodium chloride, sodium chloride, acetaminophen, acetaminophen, alum & mag hydroxide-simeth, ondansetron (ZOFRAN) IV, sodium chloride flush, traMADol Past Medical History  Diagnosis Date  . Peripheral vascular disease (HCC)     s/p carotid endarterectomy  . History of bladder problems   . Hypertension   . Hyperlipidemia   . OA (osteoarthritis of spine)     with chronic pain  . coronary artery disease   . subdurah/subarachnoid hemorrhage jun 2012    left parietal, [facility] to [facility]   Past Surgical History  Procedure Laterality Date  . Cesarean section    . Carotid endarterectomy  jan 2012    left, complicated by large hematoma, evacuated  . Fracture surgery  2006    Left radial pinning  . Joint replacement      Left TKR  2010, [facility]  . Bladder suspension  2003    [facility]  . Cataract extraction, bilateral  August 2013  . Abdominal hysterectomy  1968    menorrhagia  . Peripheral vascular catheterization Left 04/27/2015    Procedure: Lower Extremity Angiography;  Surgeon: Renford Dills, MD;  Location: [facility] INVASIVE CV LAB;  Service: Cardiovascular;  Laterality: Left;  . Peripheral vascular catheterization Left 04/27/2015    Procedure: Lower Extremity Intervention;  Surgeon: Renford Dills, MD;  Location: [facility] INVASIVE CV LAB;  Service: Cardiovascular;  Laterality: Left;   Social History   Social History  . Marital Status: Widowed    Spouse Name: N/A  . Number of Children: N/A  . Years of Education: N/A   Occupational History  . Not on file.   Social History Main Topics  . Smoking status: Former Smoker    Quit date: 06/07/1995  . Smokeless tobacco: Never Used  . Alcohol Use: No  . Drug Use: No  . Sexual Activity: Not on file   Other Topics Concern  . Not on file   Social History Narrative   Lives alone after divorce in 16. Daughter in unemployed and living with her since August 2008 (and granddaughter, 50).      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 Nitrofurantoin Monohyd Macro Other (See Comments)  . Oxycodone Hcl   . Tetanus Toxoids Swelling    Last set of Vital Signs (not current) Filed Vitals:   Jun 25, 2016 0749 Jun 25, 2016 0755  BP: 118/58 118/58  Pulse: 123 138  Temp: 99.4 F (37.4 C)   Resp:        Physical Exam  Gen: unresponsive Cardiovascular: pulseless  Resp: apneic. Coarse breath sounds, copious vomitus coming from her mouth Abd: nondistended  Neuro: GCS 3, unresponsive to pain  HEENT: Copious vomitus coming out of her mouth and possible aspiration Musculoskeletal: No deformity  Skin: warm  Procedures  INTUBATION Performed by: Daryel NovemberWilliams, Jonathan E Required items:  required blood products, implants, devices, and special equipment available Patient identity confirmed: provided demographic data and hospital-assigned identification number Time out: Immediately prior to procedure a "time out" was called to verify the correct patient, procedure, equipment, support staff and site/side marked as required. Indications: Unresponsive, airway protection, active vomiting  Intubation method: Glasgow Preoxygenation: 100% BVM Sedatives: No  Paralytic: No  Tube Size: 7.5 cuffed Post-procedure assessment: chest rise and ETCO2 monitor Breath sounds: equal and absent over the epigastrium Tube secured by Respiratory Therapy Patient tolerated the procedure well with no immediate complications.   Cardiopulmonary Resuscitation (CPR) Procedure Note  Directed/Performed by: Emily FilbertWilliams, Jonathan E I personally directed ancillary staff and/or performed CPR in an effort to regain return of spontaneous circulation and to maintain cardiac, neuro and systemic perfusion.    Medical Decision making  Patient was resuscitated, airway was placed by me. Was a difficult airway due to the copious vomitus and faulty laryngoscope. I scope was used to intubate the patient. She had return of spontaneous circulation, he had been given 3 mg of epinephrine and chest compressions prior to return of circulation.   Assessment and Plan  Cardiopulmonary arrest, likely aspiration due to vomiting Patient care was transferred to Dr. Belia HemanKasa in the ICU.  Emily FilbertJonathan E Williams, MD Jun 25, 2016 [phone]

## 2016-01-17 NOTE — Progress Notes (Signed)
Pharmacy Antibiotic Note  Leah Preston is a 82 y.o. female admitted on 2016/04/06 with aspiration pneumonia.  Pharmacy has been consulted for Zosyn dosing.  Plan: Zosyn 3.375g IV q8h (4 hour infusion).  Height: 5\' 4"  (162.6 cm) Weight: 137 lb 3.2 oz (62.234 kg) IBW/kg (Calculated) : 54.7  Temp (24hrs), Avg:98.7 F (37.1 C), Min:98 F (36.7 C), Max:99.5 F (37.5 C)   Recent Labs Lab 2016/04/20 1126 01/07/16 0616  WBC 14.6* 12.4*  CREATININE 0.99 0.61    Estimated Creatinine Clearance: 46.8 mL/min (by C-G formula based on Cr of 0.61).    Allergies  Allergen Reactions  . Nitrofurantoin Monohyd Macro Other (See Comments)  . Oxycodone Hcl   . Tetanus Toxoids Swelling    Antimicrobials this admission: Levaquin 4/21 >> 4/22 Zosyn 4/22 >>   Dose adjustments this admission:   Microbiology results: MRSA PCR: pending  Thank you for allowing pharmacy to be a part of this patient's care.  Luisa HartChristy, Etsuko Dierolf D 01/09/2016 9:26 AM

## 2016-01-17 NOTE — Evaluation (Signed)
Physical Therapy Evaluation Patient Details Name: Leah Preston MRN: [id number] DOB: 07-11-1931 Today's Date: Jan 31, 2016   History of Present Illness  82 yo female with onset of fall with R groin bruising, has new L external capsule infarct, with onset of loss of consciousness bedside and as she was assisted to bed began to vomit on her side.  Called rapid response and code was performed by staff with suctioning of emesis.  PMHx:  PVD, carotid endarterectomy, SAH, OA of spine.  Pt also has subacute T1 and T3 fractures, possible previous falls?  Clinical Impression  Pt was not able to be completely assessed but of the part PT initially saw, pt was too weak to control balance and resistive of trying to sit up to midline, leaning to foot of bed from L side of bed.  Will potentially have another visit with pt when MD reorders, if appropriate.      Follow Up Recommendations      Equipment Recommendations       Recommendations for Other Services Other (comment) (Await MD recommendations)     Precautions / Restrictions Precautions Precautions: Fall (telemetry) Restrictions Weight Bearing Restrictions: No      Mobility  Bed Mobility Overal bed mobility: +2 for physical assistance;+ 2 for safety/equipment;Needs Assistance Bed Mobility: Supine to Sit;Sit to Supine     Supine to sit: Max assist;+2 for physical assistance;+2 for safety/equipment Sit to supine: Total assist;+2 for physical assistance;+2 for safety/equipment      Transfers                 General transfer comment: unable to attempt as be became unresponsive while sitting bedside  Ambulation/Gait             General Gait Details: not attempted  Stairs            Wheelchair Mobility    Modified Rankin (Stroke Patients Only)       Balance Overall balance assessment: History of Falls;Needs assistance Sitting-balance support: Feet supported Sitting balance-Leahy Scale: Poor                                        Pertinent Vitals/Pain Pain Assessment: Faces Faces Pain Scale: Hurts little more Pain Location: R leg Pain Intervention(s): Limited activity within patient's tolerance;Monitored during session;Repositioned    Home Living Family/patient expects to be discharged to:: Unsure                      Prior Function Level of Independence: Independent with assistive device(s)         Comments: per chart pt used a cane and walker     Hand Dominance        Extremity/Trunk Assessment   Upper Extremity Assessment: Generalized weakness           Lower Extremity Assessment: Generalized weakness      Cervical / Trunk Assessment: Kyphotic  Communication   Communication: Expressive difficulties;Other (comment) (minimal verbalizations, could not answer PT questions)  Cognition Arousal/Alertness: Lethargic Behavior During Therapy: Flat affect Overall Cognitive Status: No family/caregiver present to determine baseline cognitive functioning                      General Comments General comments (skin integrity, edema, etc.): Pt was assisting with the effort to sit and met PT's eyes when asking questions then stopped,  and her chin dropped down on her chest.  Assisted the patient back to bed on L side and she began to vomit, and called nursing as she was assisted to the bed.  Code called to get team in to begin CPR.  Pt was taken to ICU with a heart rhythm and breathing with bag     Exercises        Assessment/Plan    PT Assessment  (PT suspended for her ICU care)  PT Diagnosis Generalized weakness;Altered mental status;Other (comment) (Cardiorespiratory crisis )   PT Problem List    PT Treatment Interventions     PT Goals (Current goals can be found in the Care Plan section)      Frequency     Barriers to discharge        Co-evaluation               End of Session Equipment Utilized During Treatment:  Oxygen Activity Tolerance: Other (comment) (sent to ICU for emergent care) Patient left: in bed;with nursing/sitter in room;Other (comment) (MD and nursing, respiratory staff in to intubate and CPR) Nurse Communication: Other (comment);Mobility status (Pt had CPR during PT visit, talked about her narrative )         Time: 0810-0846 PT Time Calculation (min) (ACUTE ONLY): 36 min   Charges:   PT Evaluation $PT Eval Moderate Complexity: 1 Procedure PT Treatments $Therapeutic Activity: 8-22 mins   PT G Codes:        Ramond Dial 2016-02-02, 10:53 AM   Mee Hives, PT MS Acute Rehab Dept. Number: [facility] [id number] and [place] [phone]

## 2016-01-17 NOTE — Significant Event (Addendum)
Rapid Response Event Note  Overview: rapid response called for room 111, before I had even left ICU code blue was called, went for back up assistance anyway      Initial Focused Assessment:upon entering room, ED team was there along with many 1C staff and nursing supervisor who had initiated CPR and ACLS protocols.  Interventions: pt subsequently had return of pulse and transferred to ICU 13   Event Summary:   at      at  rapid response called at 0822        Leah Preston A

## 2016-01-17 NOTE — Progress Notes (Signed)
[facility] Consultation      Name: Leah Preston MRN: [id number] DOB: 1931/05/30    ADMISSION DATE:  01/16/2016    CHIEF COMPLAINT:  Acute resp failure and cardiac arrest   HISTORY OF PRESENT ILLNESS  82 yo white female admitted for confusion, code blue called this AM, evidence of acute aspiration of gastric ocntents CPR started approx 10 mins, intubated, transferring to ICU\ Admitted 4/20 for acute CVA, confusion, patient with distended abd starting vomiting, excessive amounts of gastric secretios suctioned from mouth and airways   SIGNIFICANT EVENTS  4/22 cardiac arrest intubated   PAST MEDICAL HISTORY    :  Past Medical History  Diagnosis Date  . Peripheral vascular disease (HCC)     s/p carotid endarterectomy  . History of bladder problems   . Hypertension   . Hyperlipidemia   . OA (osteoarthritis of spine)     with chronic pain  . coronary artery disease   . subdurah/subarachnoid hemorrhage jun 2012    left parietal, sent to [facility]   Past Surgical History  Procedure Laterality Date  . Cesarean section    . Carotid endarterectomy  jan 2012    left, complicated by large hematoma, evacuated  . Fracture surgery  2006    Left radial pinning  . Joint replacement      Left TKR 2010, [facility]  . Bladder suspension  2003    [facility]  . Cataract extraction, bilateral  August 2013  . Abdominal hysterectomy  1968    menorrhagia  . Peripheral vascular catheterization Left 04/27/2015    Procedure: Lower Extremity Angiography;  Surgeon: Renford Dills, MD;  Location: [facility] INVASIVE CV LAB;  Service: Cardiovascular;  Laterality: Left;  . Peripheral vascular catheterization Left 04/27/2015    Procedure: Lower Extremity Intervention;  Surgeon: Renford Dills, MD;  Location: [facility] INVASIVE CV LAB;  Service: Cardiovascular;  Laterality: Left;   Prior to Admission medications   Medication Sig Start Date End Date Taking? Authorizing Provider  amLODipine  (NORVASC) 10 MG tablet Take 1 tablet (10 mg total) by mouth daily. 06/15/15  Yes Sherlene Shams, MD  aspirin 81 MG tablet Take 81 mg by mouth daily.   Yes Historical Provider, MD  atorvastatin (LIPITOR) 80 MG tablet Take 1 tablet (80 mg total) by mouth daily at 6 PM. 06/15/15  Yes Sherlene Shams, MD  cephALEXin (KEFLEX) 250 MG capsule Take 250 mg by mouth at bedtime.   Yes Historical Provider, MD  clopidogrel (PLAVIX) 75 MG tablet Take 75 mg by mouth daily. 12/15/14  Yes Historical Provider, MD  clorazepate (TRANXENE) 7.5 MG tablet Take 1 tablet (7.5 mg total) by mouth daily. 04/19/15  Yes Sherlene Shams, MD  esomeprazole (NEXIUM) 40 MG capsule Take 40 mg by mouth daily.   Yes Historical Provider, MD  levothyroxine (SYNTHROID, LEVOTHROID) 50 MCG tablet Take 50 mcg by mouth daily before breakfast.   Yes Historical Provider, MD  lisinopril (PRINIVIL,ZESTRIL) 20 MG tablet Take 20 mg by mouth 2 (two) times daily.   Yes Historical Provider, MD  metoprolol (LOPRESSOR) 50 MG tablet Take 50 mg by mouth 2 (two) times daily.   Yes Historical Provider, MD  Multiple Vitamins-Minerals (CENTRUM SILVER PO) Take 1 tablet by mouth daily.    Yes Historical Provider, MD  vitamin C (ASCORBIC ACID) 500 MG tablet Take 500 mg by mouth daily.   Yes Historical Provider, MD  amitriptyline (ELAVIL) 50 MG tablet TAKE 1 TABLET DAILY 01/07/16  Sherlene Shams, MD  meloxicam (MOBIC) 15 MG tablet TAKE 1 TABLET DAILY 01/07/16   Sherlene Shams, MD   Allergies  Allergen Reactions  . Nitrofurantoin Monohyd Macro Other (See Comments)  . Oxycodone Hcl   . Tetanus Toxoids Swelling     FAMILY HISTORY   Family History  Problem Relation Age of Onset  . Stroke Mother       SOCIAL HISTORY    reports that she quit smoking about 20 years ago. She has never used smokeless tobacco. She reports that she does not drink alcohol or use illicit drugs.  Review of Systems  Unable to perform ROS: critical illness      VITAL SIGNS     Temp:  [98 F (36.7 C)-99.4 F (37.4 C)] 99.4 F (37.4 C) (04/22 0749) Pulse Rate:  [102-138] 138 (04/22 0755) Resp:  [15-19] 17 (04/22 0504) BP: (118-186)/(58-70) 118/58 mmHg (04/22 0755) SpO2:  [92 %-97 %] 97 % (04/22 0504) HEMODYNAMICS:   VENTILATOR SETTINGS:   INTAKE / OUTPUT:  Intake/Output Summary (Last 24 hours) at 01/11/2016 0851 Last data filed at 01/07/16 2010  Gross per 24 hour  Intake      0 ml  Output   1100 ml  Net  -1100 ml       PHYSICAL EXAM   Physical Exam  Constitutional: She appears distressed.  HENT:  Head: Normocephalic and atraumatic.  Eyes: Pupils are equal, round, and reactive to light. No scleral icterus.  Neck: Normal range of motion. Neck supple.  Cardiovascular: Normal rate and regular rhythm.   No murmur heard. Pulmonary/Chest: She is in respiratory distress. She has wheezes. She has rales.  resp distress  Abdominal: She exhibits distension.  Musculoskeletal: She exhibits no edema.  Neurological:  gcs<8T  Skin: Skin is warm. No rash noted. She is diaphoretic.       LABS   LABS:  CBC  Recent Labs Lab 2016-01-20 1126 01/07/16 0616  WBC 14.6* 12.4*  HGB 8.6* 8.8*  HCT 25.4* 26.0*  PLT 184 185   Coag's  Recent Labs Lab 2016/01/20 1126  APTT 29  INR 1.15   BMET  Recent Labs Lab Jan 20, 2016 1126 01/07/16 0616  NA 131* 134*  K 4.4 4.6  CL 97* 100*  CO2 25 26  BUN 37* 28*  CREATININE 0.99 0.61  GLUCOSE 146* 129*   Electrolytes  Recent Labs Lab 01-20-2016 1126 01/07/16 0616  CALCIUM 9.4 9.4   Sepsis Markers No results for input(s): LATICACIDVEN, PROCALCITON, O2SATVEN in the last 168 hours. ABG No results for input(s): PHART, PCO2ART, PO2ART in the last 168 hours. Liver Enzymes  Recent Labs Lab Jan 20, 2016 1126  AST 72*  ALT 51  ALKPHOS 77  BILITOT 0.8  ALBUMIN 3.4*   Cardiac Enzymes  Recent Labs Lab 2016/01/20 1126  TROPONINI 0.07*   Glucose  Recent Labs Lab 2016/01/20 1133 01/11/2016 0713  12/29/2015 0842  GLUCAP 125* 131* 211*     No results found for this or any previous visit (from the past 240 hour(s)).   Current facility-administered medications:  .  0.9 %  sodium chloride infusion, 250 mL, Intravenous, PRN, Erin Fulling, MD .  0.9 %  sodium chloride infusion, 250 mL, Intravenous, PRN, Erin Fulling, MD .  acetaminophen (TYLENOL) tablet 650 mg, 650 mg, Oral, Q4H PRN, Erin Fulling, MD .  alum & mag hydroxide-simeth (MAALOX/MYLANTA) 200-200-20 MG/5ML suspension 30 mL, 30 mL, Oral, Q4H PRN, Altamese Dilling, MD, 30 mL at 01/07/16 2003 .  amitriptyline (ELAVIL) tablet 50 mg, 50 mg, Oral, Daily, Altamese DillingVaibhavkumar Vachhani, MD, 50 mg at 01/07/16 1044 .  amLODipine (NORVASC) tablet 10 mg, 10 mg, Oral, Daily, Enedina FinnerSona Patel, MD, 10 mg at 12/26/2015 0748 .  aspirin EC tablet 81 mg, 81 mg, Oral, Daily, Altamese DillingVaibhavkumar Vachhani, MD, 81 mg at 01/07/16 1044 .  atorvastatin (LIPITOR) tablet 80 mg, 80 mg, Oral, q1800, Altamese DillingVaibhavkumar Vachhani, MD, 80 mg at 01/07/16 1653 .  clorazepate (TRANXENE) tablet 7.5 mg, 7.5 mg, Oral, Daily, Altamese DillingVaibhavkumar Vachhani, MD, 7.5 mg at 01/07/16 1044 .  enoxaparin (LOVENOX) injection 40 mg, 40 mg, Subcutaneous, Q24H, Erin FullingKurian Gust Eugene, MD .  famotidine (PEPCID) IVPB 20 mg premix, 20 mg, Intravenous, Q12H, Erin FullingKurian Xin Klawitter, MD .  feeding supplement (ENSURE ENLIVE) (ENSURE ENLIVE) liquid 237 mL, 237 mL, Oral, BID BM, Oralia Manisavid Willis, MD, 237 mL at 01/07/16 1420 .  levofloxacin (LEVAQUIN) tablet 250 mg, 250 mg, Oral, Daily, Enedina FinnerSona Patel, MD .  levothyroxine (SYNTHROID, LEVOTHROID) tablet 50 mcg, 50 mcg, Oral, QAC breakfast, Altamese DillingVaibhavkumar Vachhani, MD, 50 mcg at 01/07/16 [id number] .  lisinopril (PRINIVIL,ZESTRIL) tablet 20 mg, 20 mg, Oral, BID, Altamese DillingVaibhavkumar Vachhani, MD, 20 mg at 01/13/2016 0748 .  meloxicam (MOBIC) tablet 15 mg, 15 mg, Oral, Daily, Altamese DillingVaibhavkumar Vachhani, MD, 15 mg at 01/07/16 1052 .  metoprolol (LOPRESSOR) tablet 50 mg, 50 mg, Oral, BID, Altamese DillingVaibhavkumar Vachhani, MD, 50 mg at 01/03/2016  0748 .  multivitamin with minerals tablet 1 tablet, 1 tablet, Oral, Daily, Altamese DillingVaibhavkumar Vachhani, MD, 1 tablet at 01/07/16 1044 .  ondansetron (ZOFRAN) injection 4 mg, 4 mg, Intravenous, Q6H PRN, Erin FullingKurian Taylar Hartsough, MD .  pantoprazole (PROTONIX) EC tablet 40 mg, 40 mg, Oral, Daily, Altamese DillingVaibhavkumar Vachhani, MD, 40 mg at 01/07/16 1044 .  pantoprazole (PROTONIX) injection 40 mg, 40 mg, Intravenous, QHS, Erin FullingKurian Aishwarya Shiplett, MD .  sodium chloride flush (NS) 0.9 % injection 3 mL, 3 mL, Intravenous, Q12H, Altamese DillingVaibhavkumar Vachhani, MD, 3 mL at 01/07/16 2112 .  sodium chloride flush (NS) 0.9 % injection 3 mL, 3 mL, Intravenous, Q12H, Erin FullingKurian Paige Vanderwoude, MD .  sodium chloride flush (NS) 0.9 % injection 3 mL, 3 mL, Intravenous, PRN, Erin FullingKurian Montrelle Eddings, MD .  traMADol (ULTRAM) tablet 50 mg, 50 mg, Oral, QID PRN, Enedina FinnerSona Patel, MD, 50 mg at 01/07/16 1725 .  vitamin C (ASCORBIC ACID) tablet 500 mg, 500 mg, Oral, Daily, Altamese DillingVaibhavkumar Vachhani, MD, 500 mg at 01/07/16 1044  IMAGING    Mr Brain Wo Contrast  01/07/2016  CLINICAL DATA:  Recent fall.  Weakness.  Confusion. EXAM: MRI HEAD WITHOUT CONTRAST TECHNIQUE: Multiplanar, multiecho pulse sequences of the brain and surrounding structures were obtained without intravenous contrast. COMPARISON:  CTA 09-22-15 FINDINGS: Small focus of restricted diffusion in the left external capsule. This is a probable small acute infarct measuring approximately 4 mm. No other areas of acute infarct Moderate chronic microvascular ischemic change in the white matter. Chronic infarcts in the basal ganglia and pons bilaterally. Moderate to advanced atrophy Chronic hemorrhage in the left frontal lobe. Chronic areas of hemorrhage in the cerebellum bilaterally. These could be due to trauma. Hypertension and cerebral amyloid other possibilities. No subdural fluid collection identified. Pituitary and skull base normal. Normal orbit. Paranasal sinuses clear. IMPRESSION: Probable small acute infarct left external capsule Moderate  to advanced atrophy. Moderate chronic microvascular ischemic change. Several areas of chronic hemorrhage in the brain, possibly related to trauma or hypertension or cerebral amyloid. Electronically Signed   By: Marlan Palauharles  Clark M.D.   On: 01/07/2016 15:33     INDWELLING  DEVICES:: ETT 4/22>>>  MICRO DATA: MRSA PCR  Urine  Blood Resp   ANTIMICROBIALS: zosyn 4/22>>>    ASSESSMENT/PLAN  82 yo white female with acute CVA with acute cardiac arrest from acute aspiration of gastric contents  PULMONARY 1.Respiratory Failure -continue Full MV support -continue Bronchodilator Therapy -Wean Fio2 and PEEP as tolerated -start abx -abg pending   CARDIOVASCULAR Needs ICU monitoring Vasopressors if needed  RENAL Follow UO, needs foley catheter  GASTROINTESTINAL Place OG to suction  HEMATOLOGIC Follow h/h  INFECTIOUS zosn for aspiration pneumonia  ENDOCRINE - ICU hypoglycemic\Hyperglycemia protocol   NEUROLOGIC - intubated and sedated - minimal sedation to achieve a RASS goal: -1     I have personally obtained a history, examined the patient, evaluated laboratory and independently reviewed  imaging results, formulated the assessment and plan and placed orders.  The Patient requires high complexity decision making for assessment and support, frequent evaluation and titration of therapies, application of advanced monitoring technologies and extensive interpretation of multiple databases. Critical Care Time devoted to patient care services described in this note is 50 minutes  Overall, patient is critically ill, prognosis is guarded. Patient at high risk for cardiac arrest and death.  Will update family and place CVl, recommend DNR status   Katheleen Stella Santiago Glad, M.D.  [facility]  Medical Director [facility] Medical Director [facility] Cardio-Pulmonary Department

## 2016-01-17 NOTE — Plan of Care (Addendum)
0815: Was called to pt's room by PT and nursing student.  Pt had become unresponsive, and was vomiting copious amts of brown liquid.  Code was called b/c pt was pulseless.  Subsequently got a pulse back and pt transferred up to [id number].  Charge nurse spoke to son and asked him to come in asap.   0745: Pt had been alert but confused at beginning of shift - oriented only to self. It was reported to me at shift change that pt had had 1 episoded of projectile vomiting last night.    0715: Tachycardic in the 140s-150s - dr was called and was told to give metoprolol early.  Noted in assessment that abdomen was taut, hypoactive bowel sounds - asked pt if it was painful on palpation and she sd no. Pt was slightly restless - picking at bedsheets.

## 2016-01-17 NOTE — Progress Notes (Signed)
Patient ID: Leah Preston, female   DOB: 1930-10-24, 82 y.o.   MRN: [id number] [facility] [place] [facility]   PATIENT NAME: Leah Preston    MR#:  [id number]  DATE OF BIRTH:  09-Jul-1931  SUBJECTIVE:  CODE BLUE was called after patient had projectile vomiting and became unresponsive. CPR was initiated by ED physician. ICU attending present. Patient was intubated and placed on the ventilator. She had about 200-2 50 cc of coffee-ground emesis that were suctioned. Appears patient likely aspirated and went into acute hypoxic respiratory failure.  REVIEW OF SYSTEMS:   Review of Systems  Unable to perform ROS: intubated   Tolerating Diet: Patient intubated Tolerating PT: Pending since patient on the ventilator  DRUG ALLERGIES:   Allergies  Allergen Reactions  . Nitrofurantoin Monohyd Macro Other (See Comments)  . Oxycodone Hcl   . Tetanus Toxoids Swelling    VITALS:  Blood pressure 118/58, pulse 138, temperature 99.4 F (37.4 C), temperature source Oral, resp. rate 17, height 5\' 4"  (1.626 m), weight 62.234 kg (137 lb 3.2 oz), SpO2 97 %.  PHYSICAL EXAMINATION:   Physical Exam  GENERAL:  82 y.o.-year-old patient lying in the bed withacute distress. Secondary to projectile vomiting and possible aspiration EYES: Pupils equal, round, reactive to light and accommodation. No scleral icterus. Extraocular muscles intact.  HEENT: Head atraumatic, normocephalic. Oropharynx and nasopharynx clear. Intubated on the ventilator NECK:  Supple, no jugular venous distention. No thyroid enlargement, no tenderness.  LUNGS: Normal breath sounds bilaterally, no wheezing, rales, rhonchi. No use of accessory muscles of respiration.  CARDIOVASCULAR: S1, S2 normal. No murmurs, rubs, or gallops. Tachycardia ABDOMEN: Soft, nontender, nondistended. Bowel sounds present. No organomegaly or mass.  EXTREMITIES: No cyanosis, clubbing or edema b/l.    NEUROLOGIC: Unable to assess since  patient is intubated. PSYCHIATRIC:  Intubated on the ventilator SKIN: No obvious rash, lesion, or ulcer.   LABORATORY PANEL:  CBC  Recent Labs Lab 01/07/16 0616  WBC 12.4*  HGB 8.8*  HCT 26.0*  PLT 185    Chemistries   Recent Labs Lab 01/07/2016 1126 01/07/16 0616  NA 131* 134*  K 4.4 4.6  CL 97* 100*  CO2 25 26  GLUCOSE 146* 129*  BUN 37* 28*  CREATININE 0.99 0.61  CALCIUM 9.4 9.4  AST 72*  --   ALT 51  --   ALKPHOS 77  --   BILITOT 0.8  --    Cardiac Enzymes  Recent Labs Lab 01/09/2016 1126  TROPONINI 0.07*   RADIOLOGY:  Ct Angio Head W/cm &/or Wo Cm  01/05/2016  ADDENDUM REPORT: 01/07/2016 14:15 ADDENDUM: Asymmetry of the tongue is noted with fatty changes in the left tongue. This is probably due to denervation. No mass lesion is seen in the skullbase. Electronically Signed   By: Marlan Palau M.D.   On: 01/09/2016 14:15  01/14/2016  CLINICAL DATA:  Stroke.  Altered mental status and confusion EXAM: CT ANGIOGRAPHY HEAD AND NECK TECHNIQUE: Multidetector CT imaging of the head and neck was performed using the standard protocol during bolus administration of intravenous contrast. Multiplanar CT image reconstructions and MIPs were obtained to evaluate the vascular anatomy. Carotid stenosis measurements (when applicable) are obtained utilizing NASCET criteria, using the distal internal carotid diameter as the denominator. CONTRAST:  75 mL Isovue 370 IV COMPARISON:  CT head 12/28/2015 FINDINGS: CTA NECK Aortic arch: Extensive atherosclerotic calcification in the aortic arch without aneurysm or dissection. Advanced atherosclerotic calcification in the proximal  great vessels. Atherosclerotic calcification throughout the subclavian arteries bilaterally. Right upper lobe infiltrate. This may represent neoplasm or infection. Follow-up with chest x-ray and chest CT recommended. Right apical pleural-based irregular thickening likely due to scarring. Left apical scarring also noted.  Right carotid system: Atherosclerotic calcification throughout the right common carotid artery with less than 50% diameter stenosis in multiple areas. Calcified and noncalcified plaque in the right carotid bifurcation. 40% diameter stenosis proximal right internal carotid artery. 75% stenosis origin right external carotid artery. Findings consistent with fibromuscular dysplasia in the cervical carotid without aneurysm or dissection. Left carotid system: Advanced atherosclerotic disease in the common carotid artery with multiple areas of less than 50% diameter stenosis. Noncalcified plaque in the left common carotid artery without significant stenosis on the left. Changes of fibromuscular dysplasia in the left internal carotid artery without dissection or aneurysm. Vertebral arteries:Both vertebral arteries patent to the basilar. Mild atherosclerotic disease at the origin of both vertebral arteries. Mild stenosis in the mid left vertebral artery. Atherosclerotic calcification and moderate stenosis the distal vertebral artery bile Skeleton: Moderate cervical disc and a facet degeneration. Mild compression fracture T1 and moderate to severe compression fracture T3. These are of indeterminate age. Other neck: Negative for adenopathy in the neck. CTA HEAD Anterior circulation: Advanced atherosclerotic calcification in the cavernous carotid bilaterally with moderate to severe stenosis bilaterally in the cavernous segment. No aneurysm. Anterior and middle cerebral arteries patent bilaterally. Moderate disease in the A2 segment bilaterally. Mild disease in the middle cerebral artery branches bilaterally without segmental occlusion or critical stenosis. Posterior circulation: Atherosclerotic calcification distal vertebral artery bilaterally causing moderate stenosis. PICA patent bilaterally. Basilar patent with mild atherosclerotic plaque. Atherosclerotic disease with moderate stenosis in the posterior cerebral artery  bilaterally. Negative for cerebral aneurysm Venous sinuses: Patent Anatomic variants: None Delayed phase: Normal enhancement on delayed imaging. Atrophy and moderate chronic microvascular ischemic changes in the white matter. No acute cortical infarct. IMPRESSION: Right upper lobe infiltrate which may represent neoplasm or infection. Apical lung densities bilaterally most consistent with scarring. Follow-up chest x-ray and chest CT recommended for further evaluation. Severe atherosclerotic disease with heavily calcified arteries in the aortic arch and both carotid and vertebral arteries diffusely. Extensive plaque in the right common carotid artery 40% diameter stenosis proximal right internal carotid artery and 75% diameter stenosis origin right external carotid artery. Fibromuscular dysplasia cervical carotid without aneurysm or dissection Atherosclerotic plaque in the left carotid with less than 50% diameter stenosis of the common carotid artery on the left. No significant internal carotid artery stenosis. There is fibromuscular dysplasia in the left internal carotid artery. Moderate disease in distal vertebral artery bilaterally. Moderate stenosis of the posterior cerebral artery bilaterally. Heavily calcified plaque in the cavernous carotid bilaterally with moderate to severe stenosis bilaterally atherosclerotic irregularity and mild stenosis of middle cerebral artery branches bilaterally. Fractures of T1 and T3 indeterminate age Electronically Signed: By: Marlan Palau M.D. On: 01/11/2016 14:00   Dg Chest 2 View  01/12/2016  CLINICAL DATA:  Recent fall.  Pain.  Initial encounter. EXAM: CHEST  2 VIEW COMPARISON:  Single view of the chest 03/15/2011. FINDINGS: The lungs are clear. There is cardiomegaly. No pneumothorax or pleural effusion. Aortic atherosclerosis is seen. Degenerative change is present about the shoulders. IMPRESSION: No acute disease. Electronically Signed   By: Drusilla Kanner M.D.   On:  01/14/2016 17:29   Ct Head Wo Contrast  01/05/2016  CLINICAL DATA:  Larey Seat 3 days ago, altered mental status with  difficulty speaking today, patient is on blood thinners EXAM: CT HEAD WITHOUT CONTRAST TECHNIQUE: Contiguous axial images were obtained from the base of the skull through the vertex without intravenous contrast. COMPARISON:  02/23/2015 FINDINGS: No hemorrhage or extra-axial fluid. Diffuse cortical atrophy with advanced low attenuation in the deep white matter. No evidence of vascular territory infarct or mass. No hydrocephalus. There is no hemorrhage or extra-axial fluid. No skull fracture is identified. The visualized portions of the paranasal sinuses are clear. IMPRESSION: Age-related involutional change with no acute intracranial abnormalities. Critical Value/emergent results were called by telephone at the time of interpretation on 12/29/2015 at 11:38 am to Dr. Phineas SemenGRAYDON GOODMAN , who verbally acknowledged these results. Electronically Signed   By: Esperanza Heiraymond  Rubner M.D.   On: 004/10/2015 11:38   Ct Angio Neck W/cm &/or Wo/cm  12/23/2015  ADDENDUM REPORT: 004/13/2017 14:15 ADDENDUM: Asymmetry of the tongue is noted with fatty changes in the left tongue. This is probably due to denervation. No mass lesion is seen in the skullbase. Electronically Signed   By: Marlan Palauharles  Clark M.D.   On: 004/23/2017 14:15  01/15/2016  CLINICAL DATA:  Stroke.  Altered mental status and confusion EXAM: CT ANGIOGRAPHY HEAD AND NECK TECHNIQUE: Multidetector CT imaging of the head and neck was performed using the standard protocol during bolus administration of intravenous contrast. Multiplanar CT image reconstructions and MIPs were obtained to evaluate the vascular anatomy. Carotid stenosis measurements (when applicable) are obtained utilizing NASCET criteria, using the distal internal carotid diameter as the denominator. CONTRAST:  75 mL Isovue 370 IV COMPARISON:  CT head 004/22/17 FINDINGS: CTA NECK Aortic arch: Extensive  atherosclerotic calcification in the aortic arch without aneurysm or dissection. Advanced atherosclerotic calcification in the proximal great vessels. Atherosclerotic calcification throughout the subclavian arteries bilaterally. Right upper lobe infiltrate. This may represent neoplasm or infection. Follow-up with chest x-ray and chest CT recommended. Right apical pleural-based irregular thickening likely due to scarring. Left apical scarring also noted. Right carotid system: Atherosclerotic calcification throughout the right common carotid artery with less than 50% diameter stenosis in multiple areas. Calcified and noncalcified plaque in the right carotid bifurcation. 40% diameter stenosis proximal right internal carotid artery. 75% stenosis origin right external carotid artery. Findings consistent with fibromuscular dysplasia in the cervical carotid without aneurysm or dissection. Left carotid system: Advanced atherosclerotic disease in the common carotid artery with multiple areas of less than 50% diameter stenosis. Noncalcified plaque in the left common carotid artery without significant stenosis on the left. Changes of fibromuscular dysplasia in the left internal carotid artery without dissection or aneurysm. Vertebral arteries:Both vertebral arteries patent to the basilar. Mild atherosclerotic disease at the origin of both vertebral arteries. Mild stenosis in the mid left vertebral artery. Atherosclerotic calcification and moderate stenosis the distal vertebral artery bile Skeleton: Moderate cervical disc and a facet degeneration. Mild compression fracture T1 and moderate to severe compression fracture T3. These are of indeterminate age. Other neck: Negative for adenopathy in the neck. CTA HEAD Anterior circulation: Advanced atherosclerotic calcification in the cavernous carotid bilaterally with moderate to severe stenosis bilaterally in the cavernous segment. No aneurysm. Anterior and middle cerebral arteries  patent bilaterally. Moderate disease in the A2 segment bilaterally. Mild disease in the middle cerebral artery branches bilaterally without segmental occlusion or critical stenosis. Posterior circulation: Atherosclerotic calcification distal vertebral artery bilaterally causing moderate stenosis. PICA patent bilaterally. Basilar patent with mild atherosclerotic plaque. Atherosclerotic disease with moderate stenosis in the posterior cerebral artery bilaterally. Negative for cerebral aneurysm  Venous sinuses: Patent Anatomic variants: None Delayed phase: Normal enhancement on delayed imaging. Atrophy and moderate chronic microvascular ischemic changes in the white matter. No acute cortical infarct. IMPRESSION: Right upper lobe infiltrate which may represent neoplasm or infection. Apical lung densities bilaterally most consistent with scarring. Follow-up chest x-ray and chest CT recommended for further evaluation. Severe atherosclerotic disease with heavily calcified arteries in the aortic arch and both carotid and vertebral arteries diffusely. Extensive plaque in the right common carotid artery 40% diameter stenosis proximal right internal carotid artery and 75% diameter stenosis origin right external carotid artery. Fibromuscular dysplasia cervical carotid without aneurysm or dissection Atherosclerotic plaque in the left carotid with less than 50% diameter stenosis of the common carotid artery on the left. No significant internal carotid artery stenosis. There is fibromuscular dysplasia in the left internal carotid artery. Moderate disease in distal vertebral artery bilaterally. Moderate stenosis of the posterior cerebral artery bilaterally. Heavily calcified plaque in the cavernous carotid bilaterally with moderate to severe stenosis bilaterally atherosclerotic irregularity and mild stenosis of middle cerebral artery branches bilaterally. Fractures of T1 and T3 indeterminate age Electronically Signed: By: Marlan Palau M.D. On: 01/01/2016 14:00   Mr Brain Wo Contrast  01/07/2016  CLINICAL DATA:  Recent fall.  Weakness.  Confusion. EXAM: MRI HEAD WITHOUT CONTRAST TECHNIQUE: Multiplanar, multiecho pulse sequences of the brain and surrounding structures were obtained without intravenous contrast. COMPARISON:  CTA 01/12/2016 FINDINGS: Small focus of restricted diffusion in the left external capsule. This is a probable small acute infarct measuring approximately 4 mm. No other areas of acute infarct Moderate chronic microvascular ischemic change in the white matter. Chronic infarcts in the basal ganglia and pons bilaterally. Moderate to advanced atrophy Chronic hemorrhage in the left frontal lobe. Chronic areas of hemorrhage in the cerebellum bilaterally. These could be due to trauma. Hypertension and cerebral amyloid other possibilities. No subdural fluid collection identified. Pituitary and skull base normal. Normal orbit. Paranasal sinuses clear. IMPRESSION: Probable small acute infarct left external capsule Moderate to advanced atrophy. Moderate chronic microvascular ischemic change. Several areas of chronic hemorrhage in the brain, possibly related to trauma or hypertension or cerebral amyloid. Electronically Signed   By: Marlan Palau M.D.   On: 01/07/2016 15:33   Dg Hip Unilat With Pelvis 2-3 Views Left  01/07/2016  CLINICAL DATA:  Recent fall with bilateral hip pain, initial encounter EXAM: DG HIP (WITH OR WITHOUT PELVIS) 2-3V LEFT COMPARISON:  None. FINDINGS: Pelvic ring is intact. No acute fracture or dislocation is seen. Bilateral iliac stenting is noted. Contrast material is noted within the bladder consistent with the recent CT angiography. IMPRESSION: No acute abnormality noted. Electronically Signed   By: Alcide Clever M.D.   On: 01/05/2016 17:23   Dg Hip Unilat With Pelvis 2-3 Views Right  01/04/2016  CLINICAL DATA:  Recent fall with right hip pain, initial encounter EXAM: DG HIP (WITH OR WITHOUT PELVIS)  2-3V RIGHT COMPARISON:  None. FINDINGS: There is no evidence of hip fracture or dislocation. There is no evidence of arthropathy or other focal bone abnormality. IMPRESSION: No acute abnormality noted. Electronically Signed   By: Alcide Clever M.D.   On: 01/07/2016 17:24   ASSESSMENT AND PLAN:  Norelle Runnion is a 82 y.o. female with a known history of Peripheral vascular disease, status post carotid endarterectomy, hypertension, hyperlipidemia, osteoarthritis of spine, coronary artery disease, subdural in subarachnoid hemorrhage in the past, was taking aspirin and Plavix for her cardiac problems.  *Acute hypoxic respiratory  failure status post cardiorespiratory arrest status post CODE BLUE -Patient may transfer to ICU under PCCM service -CPR was initiated by ED physician. Patient was intubated. Patient had projectile vomiting prior to the code. Seems she aspirated since significant amount of tracheal aspirate was suctioned Further management per ICU attending  * Confusion  This may be multifactorial, most likely metabolic encephalopathy secondary to pain medication use and stress,  CT of the neck also reports some possible infiltrate on the upper lobe of the lungs, and white cell count is elevated. Was on po levaquin emprically  Urinalysis is negative for bleeding and white blood cells. MRI positive for acute small lacunar infarct left external capsule  She was seen by neurologist Dr. Thad Ranger  * Pelvic pain  She had a fall yesterday.  no pelvic fracture   Pain management as needed. Pt has T1 and the 3 compression fracture. Orthopedic consultation with Dr. Eber Jones appreciated  * Coronary artery disease status post stent  Continue cardiac medications aspirin and Plavix  * Hyperlipidemia  Continue statin.  Case discussed with Care Management/Social Worker. Patient's family will be updated by dr Belia Heman    CODE STATUS: Full   DVT Prophylaxis: Lovenox  TOTAL CRITICAL TIME  TAKING CARE OF THIS PATIENT: 40 minutes.  >50% time spent on counselling and coordination of care Dr. Mayford Knife ER, Dr. Belia Heman   Note: This dictation was prepared with Dragon dictation along with smaller phrase technology. Any transcriptional errors that result from this process are unintentional.  Kamaree Berkel M.D on 01/11/2016 at 8:44 AM  Between 7am to 6pm - Pager - [phone]  After 6pm go to [email or web address] - password [email or web address] [facility]  [facility]  Office  [phone]  CC: Primary care physician; Sherlene Shams, MD

## 2016-01-17 NOTE — Plan of Care (Signed)
Problem: Safety: Goal: Ability to remain free from injury will improve Outcome: Not Progressing Patient impulsive with frequent attempts to get out of bed.  Bed alarm on throughout shift.

## 2016-01-17 DEATH — deceased

## 2016-06-09 IMAGING — MR MR HEAD W/O CM
10 series · 48 of 48 positions shown · non-contrast
Comparison: CTA 01/06/2016

CLINICAL DATA: Recent fall.  Weakness.  Confusion.

EXAM:
MRI HEAD WITHOUT CONTRAST
TECHNIQUE: Multiplanar, multiecho pulse sequences of the brain and surrounding
structures were obtained without intravenous contrast.

[Series 2: T1 · sagittal · 5.0mm · 0.45mm/px · 3 of 29 slices shown (1 of 2)]
[im 1/29]
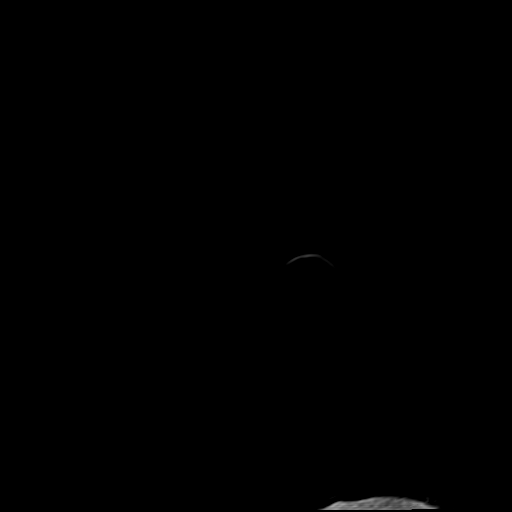
[im 15/29]
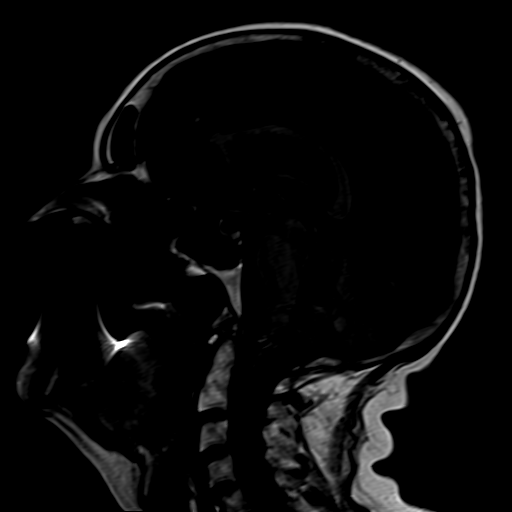
[im 29/29]
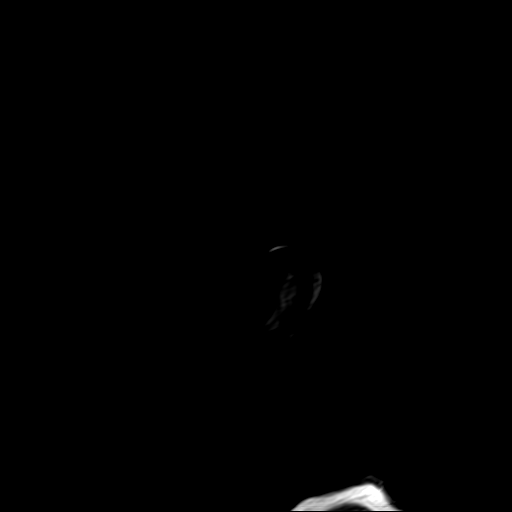

[Series 4: DWI · axial · 3.0mm · 1.80mm/px · z∈[-32,+107]mm · 7 of 55 slices shown (1 of 2)]
[im 1/55]
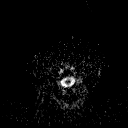
[im 10/55]
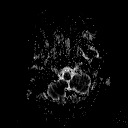
[im 19/55]
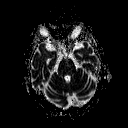
[im 28/55]
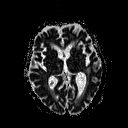
[im 37/55]
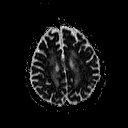
[im 46/55]
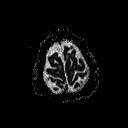
[im 55/55]
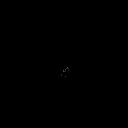

[Series 6: DWI · coronal · 3.0mm · 1.80mm/px · 6 of 48 slices shown (2 of 2)]
[im 1/48]
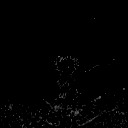
[im 10/48]
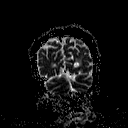
[im 19/48]
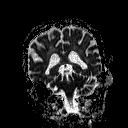
[im 29/48]
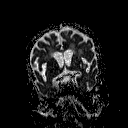
[im 38/48]
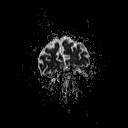
[im 48/48]
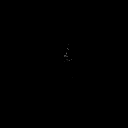

[Series 7: T2 · axial · 5.0mm · 0.60mm/px · z∈[-28,+106]mm · 3 of 25 slices shown (1 of 3)]
[im 1/25]
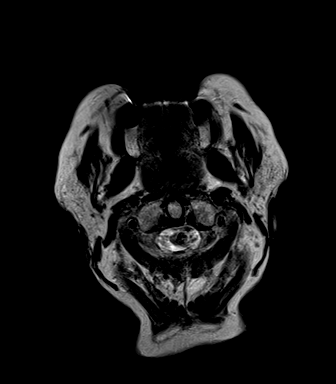
[im 13/25]
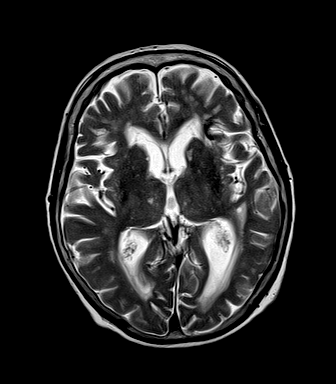
[im 25/25]
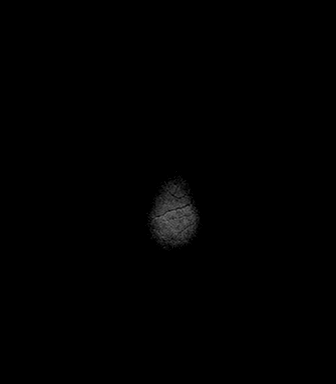

[Series 8: FLAIR · axial · 5.0mm · 0.45mm/px · z∈[-28,+106]mm · 3 of 25 slices shown]
[im 1/25]
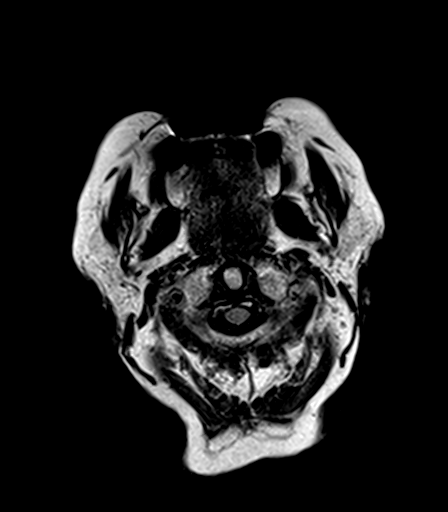
[im 13/25]
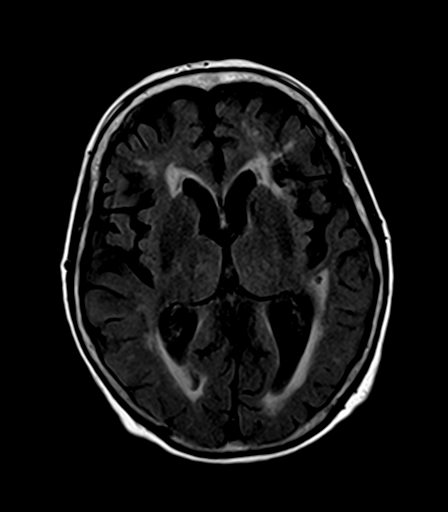
[im 25/25]
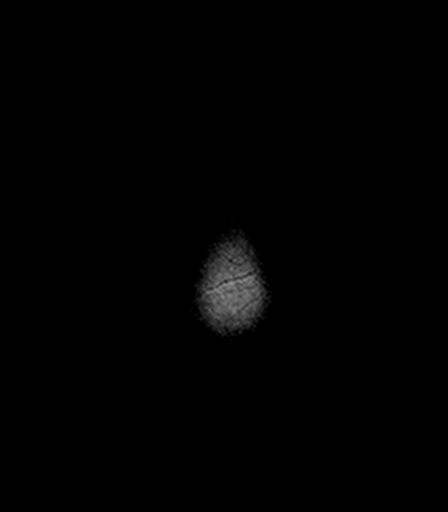

[Series 9: T2 · axial · 5.0mm · 0.45mm/px · z∈[-28,+106]mm · 3 of 25 slices shown (2 of 3)]
[im 1/25]
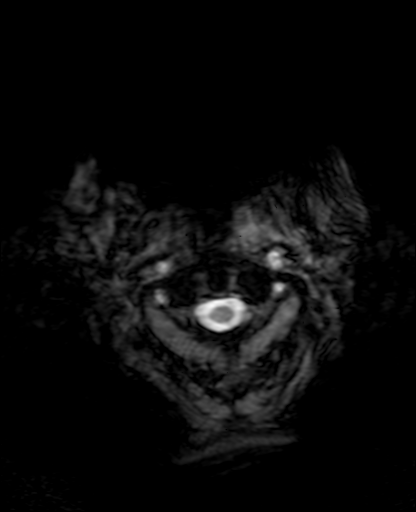
[im 13/25]
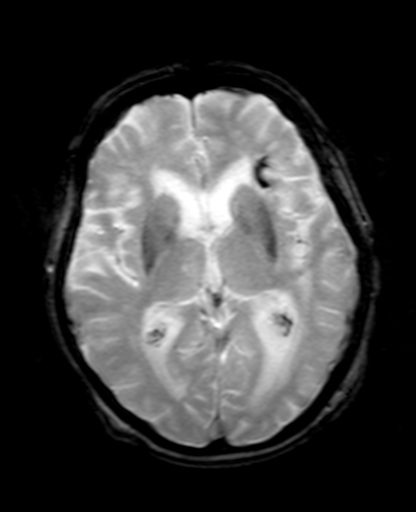
[im 25/25]
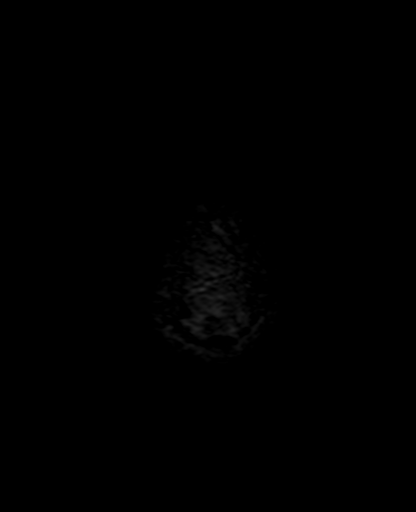

[Series 10: T1 · axial · 3.0mm · 1.00mm/px · z∈[-30,+122]mm · 7 of 60 slices shown (2 of 2)]
[im 1/60]
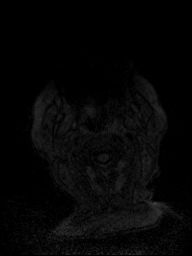
[im 10/60]
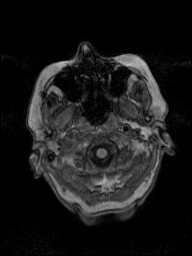
[im 20/60]
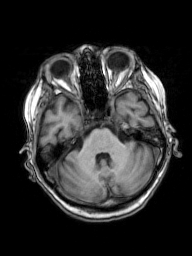
[im 30/60]
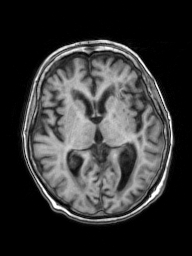
[im 40/60]
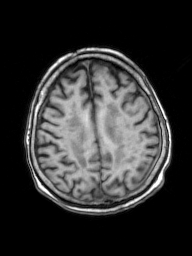
[im 50/60]
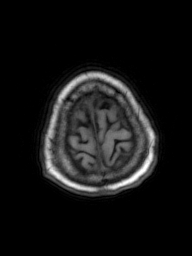
[im 60/60]
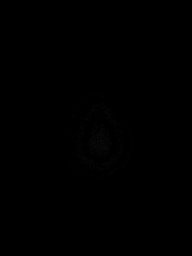

[Series 11: T2 · coronal · 5.0mm · 0.49mm/px · 4 of 31 slices shown (3 of 3)]
[im 1/31]
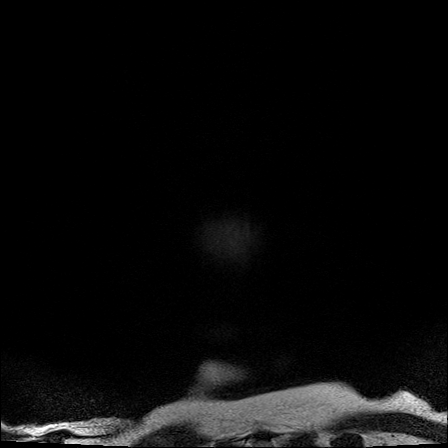
[im 11/31]
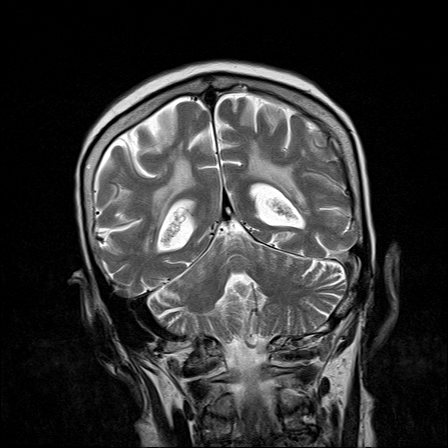
[im 21/31]
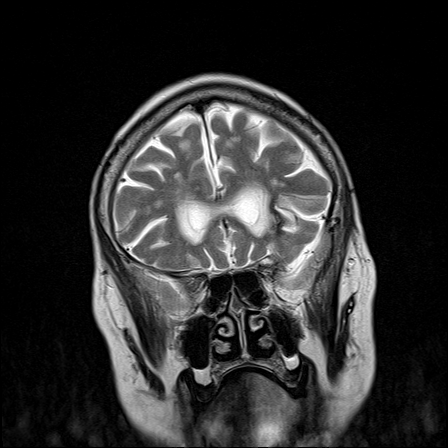
[im 31/31]
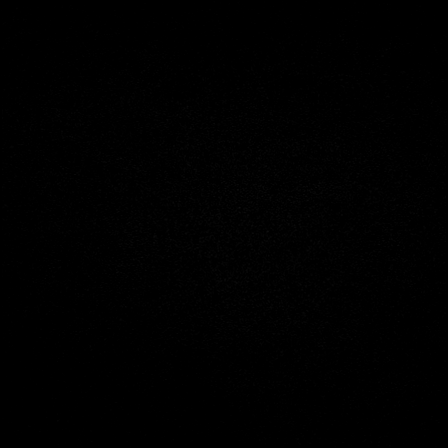

[Series 100: ax (id) · axial · 3.0mm · 1.80mm/px · z∈[-32,+107]mm · 6 of 53 slices shown]
[im 1/53]
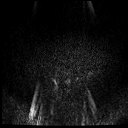
[im 11/53]
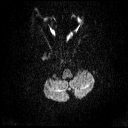
[im 21/53]
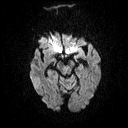
[im 32/53]
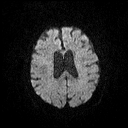
[im 42/53]
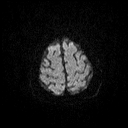
[im 53/53]
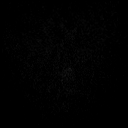

[Series 101: cor (id) · coronal · 3.0mm · 1.80mm/px · 6 of 48 slices shown]
[im 1/48]
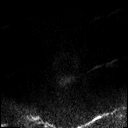
[im 10/48]
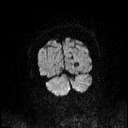
[im 19/48]
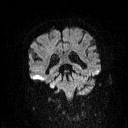
[im 29/48]
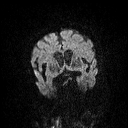
[im 38/48]
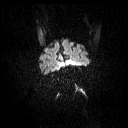
[im 48/48]
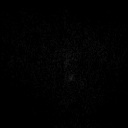

[48 of 48 positions shown; findings below may reference images not displayed]

FINDINGS: Small focus of restricted diffusion in the left external capsule.
This is a probable small acute infarct measuring approximately 4 mm.
No other areas of acute infarct

Moderate chronic microvascular ischemic change in the white matter.
Chronic infarcts in the basal ganglia and pons bilaterally.

Moderate to advanced atrophy

Chronic hemorrhage in the left frontal lobe. Chronic areas of
hemorrhage in the cerebellum bilaterally. These could be due to
trauma. Hypertension and cerebral amyloid other possibilities. No
subdural fluid collection identified.

Pituitary and skull base normal. Normal orbit. Paranasal sinuses
clear.
IMPRESSION: Probable small acute infarct left external capsule

Moderate to advanced atrophy. Moderate chronic microvascular
ischemic change.

Several areas of chronic hemorrhage in the brain, possibly related
to trauma or hypertension or cerebral amyloid.

## 2016-06-10 IMAGING — DX DG ABDOMEN 1V
1 series · 1 of 1 positions shown · non-contrast
Comparison: None.

CLINICAL DATA: Nasogastric tube placement.

EXAM:
ABDOMEN - 1 VIEW

[abdomen kub]
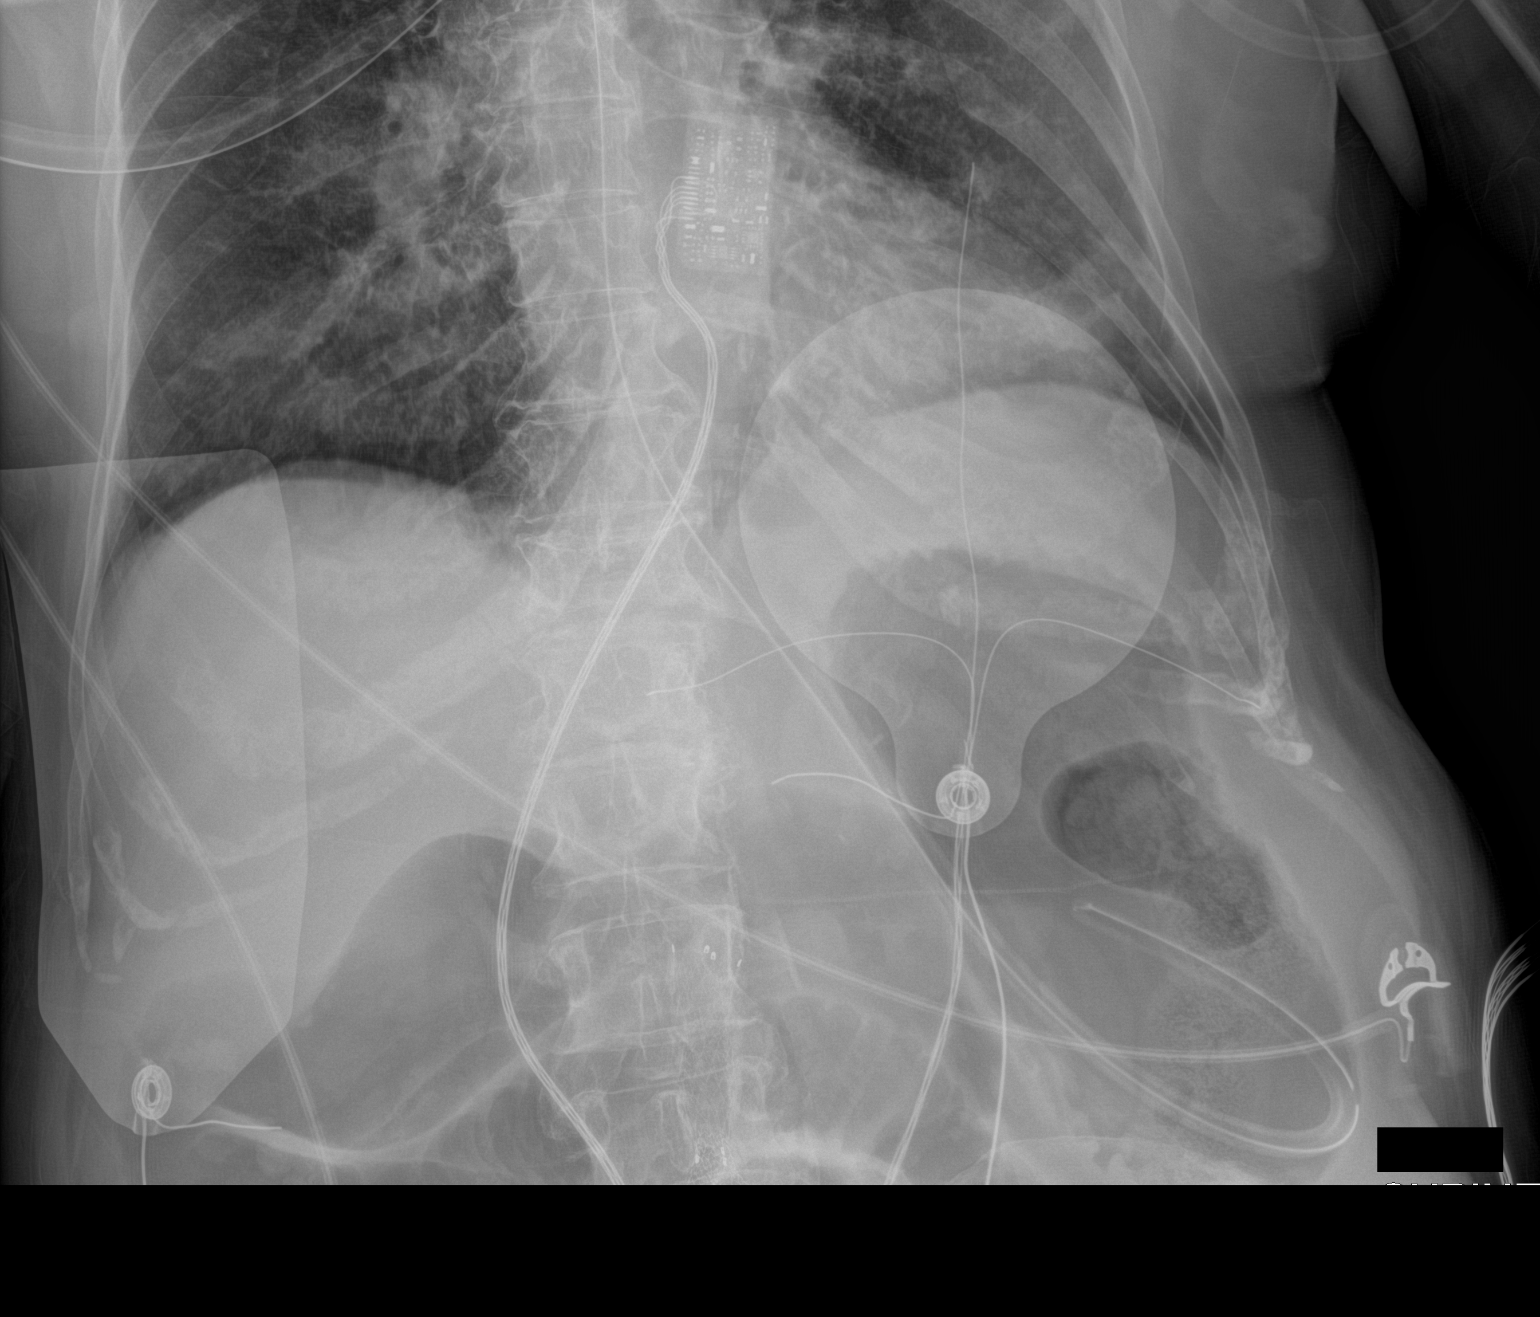

[1 of 1 positions shown; findings below may reference images not displayed]

FINDINGS: A nasogastric tube is seen with tip in the body the stomach. Several
dilated small bowel loops are noted within the upper abdomen. Pelvis
not visualized on this exam.
IMPRESSION: Nasogastric tube tip in body of stomach. Several mildly dilated
small bowel loops noted within upper abdomen.
